# Patient Record
Sex: Male | Born: 1975 | Race: White | Hispanic: No | Marital: Married | Smoking: Current every day smoker
Health system: Southern US, Community
[De-identification: ages and names within clinical notes are randomized; demographics above are authoritative.]

## PROBLEM LIST (undated history)

## (undated) DIAGNOSIS — G4700 Insomnia, unspecified: Secondary | ICD-10-CM

## (undated) DIAGNOSIS — K625 Hemorrhage of anus and rectum: Secondary | ICD-10-CM

## (undated) DIAGNOSIS — F32A Depression, unspecified: Secondary | ICD-10-CM

## (undated) DIAGNOSIS — Z720 Tobacco use: Secondary | ICD-10-CM

## (undated) DIAGNOSIS — K219 Gastro-esophageal reflux disease without esophagitis: Secondary | ICD-10-CM

## (undated) DIAGNOSIS — E785 Hyperlipidemia, unspecified: Secondary | ICD-10-CM

## (undated) DIAGNOSIS — F329 Major depressive disorder, single episode, unspecified: Secondary | ICD-10-CM

## (undated) DIAGNOSIS — D473 Essential (hemorrhagic) thrombocythemia: Secondary | ICD-10-CM

## (undated) DIAGNOSIS — E7800 Pure hypercholesterolemia, unspecified: Secondary | ICD-10-CM

## (undated) HISTORY — DX: Major depressive disorder, single episode, unspecified: F32.9

## (undated) HISTORY — DX: Depression, unspecified: F32.A

## (undated) HISTORY — DX: Insomnia, unspecified: G47.00

## (undated) HISTORY — DX: Essential (hemorrhagic) thrombocythemia: D47.3

## (undated) HISTORY — DX: Hyperlipidemia, unspecified: E78.5

## (undated) HISTORY — DX: Gastro-esophageal reflux disease without esophagitis: K21.9

## (undated) HISTORY — DX: Pure hypercholesterolemia, unspecified: E78.00

## (undated) HISTORY — DX: Tobacco use: Z72.0

## (undated) HISTORY — DX: Hemorrhage of anus and rectum: K62.5

---

## 1999-06-19 ENCOUNTER — Ambulatory Visit (HOSPITAL_COMMUNITY): Admission: RE | Admit: 1999-06-19 | Discharge: 1999-06-19 | Payer: Self-pay | Admitting: Specialist

## 1999-06-19 ENCOUNTER — Encounter: Payer: Self-pay | Admitting: Specialist

## 2014-07-10 ENCOUNTER — Encounter (HOSPITAL_COMMUNITY): Payer: Self-pay | Admitting: Emergency Medicine

## 2014-07-10 ENCOUNTER — Emergency Department (HOSPITAL_COMMUNITY)
Admission: EM | Admit: 2014-07-10 | Discharge: 2014-07-10 | Disposition: A | Discharge status: Home / Self Care | Payer: BC Managed Care – PPO | Source: Home / Self Care

## 2014-07-10 DIAGNOSIS — K2970 Gastritis, unspecified, without bleeding: Secondary | ICD-10-CM

## 2014-07-10 MED ORDER — PANTOPRAZOLE SODIUM 40 MG PO TBEC: 40.0000 mg | DELAYED_RELEASE_TABLET | Freq: Every day | ORAL | Status: DC

## 2014-07-10 NOTE — Discharge Instructions (Signed)
Gastritis, Adult Zantac 150 mg twice a day Bland diet Protonix 40 mg a day Gastritis is soreness and swelling (inflammation) of the lining of the stomach. Gastritis can develop as a sudden onset (acute) or long-term (chronic) condition. If gastritis is not treated, it can lead to stomach bleeding and ulcers. CAUSES  Gastritis occurs when the stomach lining is weak or damaged. Digestive juices from the stomach then inflame the weakened stomach lining. The stomach lining may be weak or damaged due to viral or bacterial infections. One common bacterial infection is the Helicobacter pylori infection. Gastritis can also result from excessive alcohol consumption, taking certain medicines, or having too much acid in the stomach.  SYMPTOMS  In some cases, there are no symptoms. When symptoms are present, they may include:  Pain or a burning sensation in the upper abdomen.  Nausea.  Vomiting.  An uncomfortable feeling of fullness after eating. DIAGNOSIS  Your caregiver may suspect you have gastritis based on your symptoms and a physical exam. To determine the cause of your gastritis, your caregiver may perform the following:  Blood or stool tests to check for the H pylori bacterium.  Gastroscopy. A thin, flexible tube (endoscope) is passed down the esophagus and into the stomach. The endoscope has a light and camera on the end. Your caregiver uses the endoscope to view the inside of the stomach.  Taking a tissue sample (biopsy) from the stomach to examine under a microscope. TREATMENT  Depending on the cause of your gastritis, medicines may be prescribed. If you have a bacterial infection, such as an H pylori infection, antibiotics may be given. If your gastritis is caused by too much acid in the stomach, H2 blockers or antacids may be given. Your caregiver may recommend that you stop taking aspirin, ibuprofen, or other nonsteroidal anti-inflammatory drugs (NSAIDs). HOME CARE INSTRUCTIONS  Only  take over-the-counter or prescription medicines as directed by your caregiver.  If you were given antibiotic medicines, take them as directed. Finish them even if you start to feel better.  Drink enough fluids to keep your urine clear or pale yellow.  Avoid foods and drinks that make your symptoms worse, such as:  Caffeine or alcoholic drinks.  Chocolate.  Peppermint or mint flavorings.  Garlic and onions.  Spicy foods.  Citrus fruits, such as oranges, lemons, or limes.  Tomato-based foods such as sauce, chili, salsa, and pizza.  Fried and fatty foods.  Eat small, frequent meals instead of large meals. SEEK IMMEDIATE MEDICAL CARE IF:   You have black or dark red stools.  You vomit blood or material that looks like coffee grounds.  You are unable to keep fluids down.  Your abdominal pain gets worse.  You have a fever.  You do not feel better after 1 week.  You have any other questions or concerns. MAKE SURE YOU:  Understand these instructions.  Will watch your condition.  Will get help right away if you are not doing well or get worse. Document Released: 08/25/2001 Document Revised: 03/01/2012 Document Reviewed: 10/14/2011 [facility] Patient Information 2015 [place], [place]. This information is not intended to replace advice given to you by your health care provider. Make sure you discuss any questions you have with your health care provider.

## 2014-07-10 NOTE — ED Provider Notes (Signed)
CSN: [id number]     Arrival date & time 07/10/14  1645 History   First MD Initiated Contact with Patient 07/10/14 1714     Chief Complaint  Patient presents with  . Abdominal Pain   (Consider location/radiation/quality/duration/timing/severity/associated sxs/prior Treatment) HPI Comments: 38 year old male presents with epigastric pain for approximately 2 weeks. The pain is intermittent and located across the epigastrium. It is often improved after eating for approximately 2 hours before returning. Nothing seems to make it. He denies belching, indigestion, heartburn, nausea, vomiting, diarrhea or current constipation. Initially thought he was constipated and took magnesium citrate which allowed him to empty his colon. This had no benefit for his epigastric pain. 3 weeks ago he saw his PCP and was treated for sleep disturbance with Lunesta and dyslipidemia with Crestor.   History reviewed. No pertinent past medical history. History reviewed. No pertinent past surgical history. History reviewed. No pertinent family history. History  Substance Use Topics  . Smoking status: Current Every Day Smoker  . Smokeless tobacco: Not on file  . Alcohol Use: Yes    Review of Systems  Constitutional: Negative.  Negative for fever, chills and fatigue.  HENT: Negative.   Respiratory: Negative for cough, chest tightness, shortness of breath and wheezing.   Cardiovascular: Negative.   Gastrointestinal: Positive for abdominal pain. Negative for nausea, vomiting, diarrhea, blood in stool, abdominal distention, anal bleeding and rectal pain.  Genitourinary: Negative.   Skin: Negative.   Neurological: Negative for dizziness, seizures, syncope, speech difficulty, light-headedness and headaches.    Allergies  Review of patient's allergies indicates no known allergies.  Home Medications   Prior to Admission medications   Medication Sig Start Date End Date Taking? Authorizing Provider  Eszopiclone  (ESZOPICLONE) 3 MG TABS Take 3 mg by mouth at bedtime. Take immediately before bedtime   Yes Historical Provider, MD  PARoxetine (PAXIL-CR) 25 MG 24 hr tablet Take 25 mg by mouth daily.   Yes Historical Provider, MD  rosuvastatin (CRESTOR) 20 MG tablet Take 20 mg by mouth daily.   Yes Historical Provider, MD  pantoprazole (PROTONIX) 40 MG tablet Take 1 tablet (40 mg total) by mouth daily. 07/10/14   Janne Napoleon, NP   BP 134/82  Pulse 71  Temp(Src) 98.3 F (36.8 C) (Oral)  Resp 18  SpO2 98% Physical Exam  Nursing note and vitals reviewed. Constitutional: He is oriented to person, place, and time. He appears well-developed and well-nourished. No distress.  Eyes: Conjunctivae and EOM are normal.  Neck: Normal range of motion. Neck supple.  Cardiovascular: Normal rate, regular rhythm, normal heart sounds and intact distal pulses.   Pulmonary/Chest: Effort normal and breath sounds normal. No respiratory distress. He has no wheezes. He has no rales.  Abdominal: Soft. Bowel sounds are normal. He exhibits no distension and no mass. There is no rebound and no guarding.  Mild  tenderness in the epigastrium only.  Musculoskeletal: He exhibits no edema.  Neurological: He is alert and oriented to person, place, and time. He exhibits normal muscle tone.  Skin: Skin is warm and dry. No rash noted.  Psychiatric: He has a normal mood and affect.    ED Course  Procedures (including critical care time) Labs Review Labs Reviewed - No data to display  Imaging Review No results found.   MDM   1. Gastritis    Bland diet Zantac 150 bid protonix 40 q d Read instructions, Red flags discussed to seek medical care promptly. If not improving in 5-6  days, stop the [place] and Crestor for 4-5 days as a test for causality       Janne Napoleon, NP 07/10/14 1747

## 2014-07-10 NOTE — ED Provider Notes (Signed)
Medical screening examination/treatment/procedure(s) were performed by resident physician or non-physician practitioner and as supervising physician I was immediately available for consultation/collaboration.   Pauline Good MD.   Billy Fischer, MD 07/10/14 [phone]

## 2014-07-10 NOTE — ED Notes (Signed)
Pt  Reports    Upper abd  Pain   With     Symptoms          X   sev  Weeks            Pain is  In  The  Epigastric  Area         He took  Some  mag  Citrate   No  Vomiting       Sitting  Upright on  The  Exam table  In no  Severe     Distress

## 2018-12-05 ENCOUNTER — Telehealth: Payer: Self-pay

## 2018-12-05 NOTE — Telephone Encounter (Signed)
NOTES ON FILE 

## 2018-12-28 ENCOUNTER — Telehealth: Payer: Self-pay | Admitting: *Deleted

## 2018-12-28 NOTE — Progress Notes (Deleted)
Virtual Visit via Telephone Note   This visit type was conducted due to national recommendations for restrictions regarding the COVID-19 Pandemic (e.g. social distancing) in an effort to limit this patient's exposure and mitigate transmission in our community.  Due to his co-morbid illnesses, this patient is at least at moderate risk for complications without adequate follow up.  This format is felt to be most appropriate for this patient at this time.  The patient did not have access to video technology/had technical difficulties with video requiring transitioning to audio format only (telephone).  All issues noted in this document were discussed and addressed.  No physical exam could be performed with this format.  Please refer to the patient's chart for his  consent to telehealth for [facility].   Evaluation Performed:  Follow-up visit  Date:  12/28/2018   ID:  Mike Maddox, DOB [birth year], MRN [id number]  Patient Location: Home Provider Location: Office  PCP:  No primary care provider on file.  Cardiologist:  Johnsie Cancel Electrophysiologist:  None   Chief Complaint:  HLD  History of Present Illness:    Mike Maddox is a 43 y.o. male referred by Dr Sheilah Mins [facility] for HLD History of GERD, Smoking, Depression . Currently on crestor 40 mg daily Labs reviewed from 11/23/18 TC 248 TRig 90 HDL 80 LDL 150 Chol/HDL ratio 3.1 normal normal LFTls  Seems as though lipids were much better in 2017 ? Med compliance Using Citalopram and Welbutrin for depression . BP has been borderline   ***    The patient does not have symptoms concerning for COVID-19 infection (fever, chills, cough, or new shortness of breath).    No past medical history on file. No past surgical history on file.   No outpatient medications have been marked as taking for the 12/30/18 encounter (Appointment) with Josue Hector, MD.     Allergies:   Patient has no known allergies.   Social History    Tobacco Use  . Smoking status: Current Every Day Smoker  Substance Use Topics  . Alcohol use: Yes  . Drug use: Not on file     Family Hx: The patient's family history is not on file.  ROS:   Please see the history of present illness.     All other systems reviewed and are negative.   Prior CV studies:   The following studies were reviewed today:  Lab work primary March 2020  Labs/Other Tests and Data Reviewed:    EKG:  None   Recent Labs: No results found for requested labs within last 8760 hours.   Recent Lipid Panel No results found for: CHOL, TRIG, HDL, CHOLHDL, LDLCALC, LDLDIRECT  Wt Readings from Last 3 Encounters:  No data found for Wt     Objective:    Vital Signs:  There were no vitals taken for this visit.   No physical  ASSESSMENT & PLAN:    1. HLD:  TC/HDL ration fine very high HDL will do coronary calcium score to further risk stratify and identify goal of LDL lowering *** 2. BP:  Borderline continue lifestyle changes  3. Depression: continue welbutrin and citalopram 4. Smoking should have yearly CXR with primary consider adding patches to welbutrin   COVID-19 Education: The signs and symptoms of COVID-19 were discussed with the patient and how to seek care for testing (follow up with PCP or arrange E-visit).  The importance of social distancing was discussed today.  Time:  Today, I have spent 30 minutes with the patient with telehealth technology discussing the above problems.     Medication Adjustments/Labs and Tests Ordered: Current medicines are reviewed at length with the patient today.  Concerns regarding medicines are outlined above.   Tests Ordered:  Coronary calcium Score Repeat liver and lipid in 3 months Referral to lipid clinic   Medication Changes: No orders of the defined types were placed in this encounter.   Disposition:  Follow up with lipid clinic in 3 months   Signed, Jenkins Rouge, MD  12/28/2018 7:42 PM    [place]

## 2018-12-28 NOTE — Telephone Encounter (Signed)
REFERRAL SENT TO SCHEDULING AND NOTES ON FILE FROM DR. Leith.

## 2018-12-29 ENCOUNTER — Telehealth: Payer: Self-pay

## 2018-12-29 NOTE — Progress Notes (Signed)
Virtual Visit via Telephone Note   This visit type was conducted due to national recommendations for restrictions regarding the COVID-19 Pandemic (e.g. social distancing) in an effort to limit this patient's exposure and mitigate transmission in our community.  Due to his co-morbid illnesses, this patient is at least at moderate risk for complications without adequate follow up.  This format is felt to be most appropriate for this patient at this time.  The patient did not have access to video technology/had technical difficulties with video requiring transitioning to audio format only (telephone).  All issues noted in this document were discussed and addressed.  No physical exam could be performed with this format.  Please refer to the patient's chart for his  consent to telehealth for [facility].   Evaluation Performed:  Follow-up visit  Date:  12/30/2018   ID:  Mike Maddox, DOB 04-Jan-1976, MRN [id number]  Patient Location: Home Provider Location: Office  PCP:  Patient, No Pcp Per  Cardiologist:  Johnsie Cancel Electrophysiologist:  None   Chief Complaint:  HLD  History of Present Illness:    Mike Maddox is a 43 y.o. male referred by Faye Ramsay [facility] for HLD History of GERD, Smoking, Depression . Currently on crestor 40 mg daily Labs reviewed from 11/23/18 TC 248 TRig 90 HDL 80 LDL 150 Chol/HDL ratio 3.1 normal normal LFTls  Seems as though lipids were much better in 2017 ? Med compliance Using Citalopram and Welbutrin for depression . BP has been borderline Reviewed notes from [place] including labs 20 minutes  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 Active at work up / down ladders Has been on lipitor but made him feel bad. Now on 40 mg crestor No documented vascular disease BP been running high on no meds currently     The patient does not have symptoms concerning for COVID-19 infection (fever, chills, cough, or new  shortness of breath).    Past Medical History:  Diagnosis Date  . Depression   . Dyslipidemia   . 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 GERD (gastroesophageal reflux disease)   . Hypercholesterolemia   . Hyperlipidemia   . Insomnia   . Rectal bleeding   . Tobacco use    No past surgical history on file.   Current Meds  Medication Sig  . buPROPion (WELLBUTRIN XL) 150 MG 24 hr tablet Take 150 mg by mouth daily.  . citalopram (CELEXA) 40 MG tablet Take 40 mg by mouth daily.  . rosuvastatin (CRESTOR) 40 MG tablet Take 40 mg by mouth daily.     Allergies:   Ambien [zolpidem]; Chantix [varenicline tartrate]; and Lipitor [atorvastatin]   Social History   Tobacco Use  . Smoking status: Current Every Day Smoker  . Smokeless tobacco: Never Used  Substance Use Topics  . Alcohol use: Yes  . Drug use: Not on file     Family Hx: The patient's family history is not on file.  ROS:   Please see the history of present illness.     All other systems reviewed and are negative.   Prior CV studies:   The following studies were reviewed today:  Lab work primary March 2020  Labs/Other Tests and Data Reviewed:    EKG:  None   Recent Labs: No results found for requested labs within last 8760 hours.   Recent Lipid Panel No results found for: CHOL, TRIG, HDL, CHOLHDL, LDLCALC, LDLDIRECT  Wt Readings from Last 3 Encounters:  12/30/18 86.2 kg     Objective:    Vital Signs:  BP (!) 168/117   Pulse 73   Ht 6' (1.829 m)   Wt 86.2 kg   BMI 25.77 kg/m    No physical  ASSESSMENT & PLAN:    1. HLD:  TC/HDL ration fine very high HDL will do coronary calcium score to further risk stratify and identify goal of LDL lowering  Start zetia 10 mg f/u lipid clinic 3 months wit hlabs  2. BP:  Start cozaar 50 mg until he can make lifestyle changes  3. Depression: continue welbutrin and citalopram 4. Smoking should have yearly CXR with primary consider adding patches to  welbutrin   COVID-19 Education: The signs and symptoms of COVID-19 were discussed with the patient and how to seek care for testing (follow up with PCP or arrange E-visit).  The importance of social distancing was discussed today.  Time:   Today, I have spent 30 minutes with the patient with telehealth technology discussing the above problems.     Medication Adjustments/Labs and Tests Ordered: Current medicines are reviewed at length with the patient today.  Concerns regarding medicines are outlined above.   Tests Ordered:  Coronary calcium Score Repeat liver and lipid in 3 months Referral to lipid clinic   Medication Changes: No orders of the defined types were placed in this encounter.   Disposition:  Follow up with lipid clinic in 3 months   Signed, Jenkins Rouge, MD  12/30/2018 9:22 AM    [place]

## 2018-12-29 NOTE — Telephone Encounter (Signed)
Went over patient's medications and updated medication list.

## 2018-12-30 ENCOUNTER — Other Ambulatory Visit: Payer: Self-pay

## 2018-12-30 ENCOUNTER — Ambulatory Visit: Payer: Self-pay | Admitting: Cardiovascular Disease

## 2018-12-30 ENCOUNTER — Telehealth (INDEPENDENT_AMBULATORY_CARE_PROVIDER_SITE_OTHER): Payer: 59 | Admitting: Cardiovascular Disease

## 2018-12-30 VITALS — BP 168/117 | HR 73 | Ht 72.0 in | Wt 190.0 lb

## 2018-12-30 DIAGNOSIS — E785 Hyperlipidemia, unspecified: Secondary | ICD-10-CM

## 2018-12-30 DIAGNOSIS — F329 Major depressive disorder, single episode, unspecified: Secondary | ICD-10-CM

## 2018-12-30 DIAGNOSIS — Z720 Tobacco use: Secondary | ICD-10-CM | POA: Diagnosis not present

## 2018-12-30 DIAGNOSIS — I10 Essential (primary) hypertension: Secondary | ICD-10-CM | POA: Diagnosis not present

## 2018-12-30 MED ORDER — LOSARTAN POTASSIUM 50 MG PO TABS: 50.0000 mg | ORAL_TABLET | Freq: Every day | ORAL | 3 refills | Status: DC

## 2018-12-30 MED ORDER — EZETIMIBE 10 MG PO TABS: 10.0000 mg | ORAL_TABLET | Freq: Every day | ORAL | 3 refills | Status: DC

## 2018-12-30 NOTE — Patient Instructions (Addendum)
Medication Instructions:  Your physician has recommended you make the following change in your medication:  1-START Zetia10 mg by mouth daily 2-START Losartan 50 mg by mouth daily  If you need a refill on your cardiac medications before your next appointment, please call your pharmacy.   Lab work: Your physician recommends that you return for lab work in: 3 months for fasting lipid panel and CMET  If you have labs (blood work) drawn today and your tests are completely normal, you will receive your results only by: Marland Kitchen MyChart Message (if you have MyChart) OR . A paper copy in the mail If you have any lab test that is abnormal or we need to change your treatment, we will call you to review the results.  Testing/Procedures: Cardiac CT scanning for Calcium Score in 4 weeks, (CAT scanning), is a noninvasive, special x-ray that produces cross-sectional images of the body using x-rays and a computer. CT scans help physicians diagnose and treat medical conditions. For some CT exams, a contrast material is used to enhance visibility in the area of the body being studied. CT scans provide greater clarity and reveal more details than regular x-ray exams.    Follow-Up: At [facility], you and your health needs are our priority.  As part of our continuing mission to provide you with exceptional heart care, we have created designated Provider Care Teams.  These Care Teams include your primary Cardiologist (physician) and Advanced Practice Providers (APPs -  Physician Assistants and Nurse Practitioners) who all work together to provide you with the care you need, when you need it. You will need a follow up appointment in 6 months.  Please call our office 2 months in advance to schedule this appointment.  You may see Dr. Johnsie Cancel or one of the following Advanced Practice Providers on your designated Care Team:   Truitt Merle, NP Cecilie Kicks, NP . Kathyrn Drown, NP   You have been referred to [facility] in 3 months.   Ezetimibe Tablets What is this medicine? EZETIMIBE (ez ET i mibe) blocks the absorption of cholesterol from the stomach. It can help lower blood cholesterol for patients who are at risk of getting heart disease or a stroke. It is only for patients whose cholesterol level is not controlled by diet. This medicine may be used for other purposes; ask your health care provider or pharmacist if you have questions. COMMON BRAND NAME(S): Zetia What should I tell my health care provider before I take this medicine? They need to know if you have any of these conditions: -liver disease -an unusual or allergic reaction to ezetimibe, medicines, foods, dyes, or preservatives -pregnant or trying to get pregnant -breast-feeding How should I use this medicine? Take this medicine by mouth with a glass of water. Follow the directions on the prescription label. This medicine can be taken with or without food. Take your doses at regular intervals. Do not take your medicine more often than directed. Talk to your pediatrician regarding the use of this medicine in children. Special care may be needed. Overdosage: If you think you have taken too much of this medicine contact a poison control center or emergency room at once. NOTE: This medicine is only for you. Do not share this medicine with others. What if I miss a dose? If you miss a dose, take it as soon as you can. If it is almost time for your next dose, take only that dose. Do not take double or extra  doses. What may interact with this medicine? Do not take this medicine with any of the following medications: -fenofibrate -gemfibrozil This medicine may also interact with the following medications: -antacids -cyclosporine -herbal medicines like red yeast rice -other medicines to lower cholesterol or triglycerides This list may not describe all possible interactions. Give your health care provider a list of all the medicines, herbs,  non-prescription drugs, or dietary supplements you use. Also tell them if you smoke, drink alcohol, or use illegal drugs. Some items may interact with your medicine. What should I watch for while using this medicine? Visit your doctor or health care professional for regular checks on your progress. You will need to have your cholesterol levels checked. If you are also taking some other cholesterol medicines, you will also need to have tests to make sure your liver is working properly. Tell your doctor or health care professional if you get any unexplained muscle pain, tenderness, or weakness, especially if you also have a fever and tiredness. You need to follow a low-cholesterol, low-fat diet while you are taking this medicine. This will decrease your risk of getting heart and blood vessel disease. Exercising and avoiding alcohol and smoking can also help. Ask your doctor or dietician for advice. What side effects may I notice from receiving this medicine? Side effects that you should report to your doctor or health care professional as soon as possible: -allergic reactions like skin rash, itching or hives, swelling of the face, lips, or tongue -dark yellow or brown urine -unusually weak or tired -yellowing of the skin or eyes Side effects that usually do not require medical attention (report to your doctor or health care professional if they continue or are bothersome): -diarrhea -dizziness -headache -stomach upset or pain This list may not describe all possible side effects. Call your doctor for medical advice about side effects. You may report side effects to FDA at 1-800-FDA-1088. Where should I keep my medicine? Keep out of the reach of children. Store at room temperature between 15 and 30 degrees C (59 and 86 degrees F). Protect from moisture. Keep container tightly closed. Throw away any unused medicine after the expiration date. NOTE: This sheet is a summary. It may not cover all possible  information. If you have questions about this medicine, talk to your doctor, pharmacist, or health care provider.  2019 Elsevier/Gold Standard (2012-03-07 15:39:09) Losartan tablets What is this medicine? LOSARTAN (loe SAR tan) is used to treat high blood pressure and to reduce the risk of stroke in certain patients. This drug also slows the progression of kidney disease in patients with diabetes. This medicine may be used for other purposes; ask your health care provider or pharmacist if you have questions. COMMON BRAND NAME(S): Cozaar What should I tell my health care provider before I take this medicine? They need to know if you have any of these conditions: -heart failure -kidney or liver disease -an unusual or allergic reaction to losartan, other medicines, foods, dyes, or preservatives -pregnant or trying to get pregnant -breast-feeding How should I use this medicine? Take this medicine by mouth with a glass of water. Follow the directions on the prescription label. This medicine can be taken with or without food. Take your doses at regular intervals. Do not take your medicine more often than directed. Talk to your pediatrician regarding the use of this medicine in children. Special care may be needed. Overdosage: If you think you have taken too much of this medicine contact a poison  control center or emergency room at once. NOTE: This medicine is only for you. Do not share this medicine with others. What if I miss a dose? If you miss a dose, take it as soon as you can. If it is almost time for your next dose, take only that dose. Do not take double or extra doses. What may interact with this medicine? -blood pressure medicines -diuretics, especially triamterene, spironolactone, or amiloride -fluconazole -NSAIDs, medicines for pain and inflammation, like ibuprofen or naproxen -potassium salts or potassium supplements -rifampin This list may not describe all possible interactions.  Give your health care provider a list of all the medicines, herbs, non-prescription drugs, or dietary supplements you use. Also tell them if you smoke, drink alcohol, or use illegal drugs. Some items may interact with your medicine. What should I watch for while using this medicine? Visit your doctor or health care professional for regular checks on your progress. Check your blood pressure as directed. Ask your doctor or health care professional what your blood pressure should be and when you should contact him or her. Call your doctor or health care professional if you notice an irregular or fast heart beat. Women should inform their doctor if they wish to become pregnant or think they might be pregnant. There is a potential for serious side effects to an unborn child, particularly in the second or third trimester. Talk to your health care professional or pharmacist for more information. You may get drowsy or dizzy. Do not drive, use machinery, or do anything that needs mental alertness until you know how this drug affects you. Do not stand or sit up quickly, especially if you are an older patient. This reduces the risk of dizzy or fainting spells. Alcohol can make you more drowsy and dizzy. Avoid alcoholic drinks. Avoid salt substitutes unless you are told otherwise by your doctor or health care professional. Do not treat yourself for coughs, colds, or pain while you are taking this medicine without asking your doctor or health care professional for advice. Some ingredients may increase your blood pressure. What side effects may I notice from receiving this medicine? Side effects that you should report to your doctor or health care professional as soon as possible: -confusion, dizziness, light headedness or fainting spells -decreased amount of urine passed -difficulty breathing or swallowing, hoarseness, or tightening of the throat -fast or irregular heart beat, palpitations, or chest pain -skin rash,  itching -swelling of your face, lips, tongue, hands, or feet Side effects that usually do not require medical attention (report to your doctor or health care professional if they continue or are bothersome): -cough -decreased sexual function or desire -headache -nasal congestion or stuffiness -nausea or stomach pain -sore or cramping muscles This list may not describe all possible side effects. Call your doctor for medical advice about side effects. You may report side effects to FDA at 1-800-FDA-1088. Where should I keep my medicine? Keep out of the reach of children. Store at room temperature between 15 and 30 degrees C (59 and 86 degrees F). Protect from light. Keep container tightly closed. Throw away any unused medicine after the expiration date. NOTE: This sheet is a summary. It may not cover all possible information. If you have questions about this medicine, talk to your doctor, pharmacist, or health care provider.  2019 Elsevier/Gold Standard (2007-11-11 16:42:18)

## 2019-01-16 ENCOUNTER — Telehealth: Payer: Self-pay | Admitting: Pharmacist

## 2019-01-16 NOTE — Telephone Encounter (Signed)
Patient referred to Lipid clinic by Dr. Johnsie Cancel on 4/17. Patient is diagnosed with hyperlipidemia with last labs on 11/23/2018: TC 248, Trigl 90, HDL 80, LDL 150. Patient does not have history of ASCVD or a positive family history of ASCVD. ASCVD 10-yr risk 5.4%. Patient has been on rosuvastatin 40mg  daily and Zetia was added at last Cardiology visit on 4/17. Could consider aggressive LDL goal of <100. Will confirm patient is adherent to rosuvastatin since LDL in 2017 was 70. Will educate on adherence to medications and schedule labs to be drawn in mid-July.  Attempt to reach patient unsuccessful, left voicemail with call pharmacy call back number.   Gwenlyn Found, [place].Brock D PGY1 Pharmacy Resident  01/16/2019   12:47 PM

## 2019-01-18 NOTE — Telephone Encounter (Signed)
Additional call attempted on 01/18/2019. Left message instructing patient to call back.

## 2019-01-19 NOTE — Telephone Encounter (Signed)
Spoke with patient.  He just started the ezetimibe last month.  States no issues with compliance with either that or rosuvastatin.  Will go to [facility] in July to repeat cholesterol labs and have those sent to Dr. Johnsie Cancel

## 2019-01-19 NOTE — Telephone Encounter (Signed)
Returned patient call on 01/19/2019. Left message with call back instructions.

## 2019-03-16 ENCOUNTER — Telehealth: Payer: Self-pay | Admitting: *Deleted

## 2019-03-16 NOTE — Telephone Encounter (Signed)
Script Screening patients for COVID-19 and reviewing new operational procedures  Greeting - The reason I am calling is to share with you some new changes to our processes that are designed to help us keep everyone safe. Is now a good time to speak with you? Patient says "no' - ask them when you can call back and let them know it's important to do this prior to their appointment.  Patient says "yes" - Great, [name]  the first thing I need to do is ask you some screening Questions.  1. To the best of your knowledge, have you been in close contact with any one with a confirmed diagnosis of COVID 19? o No - proceed to next question  2. Have you had any one or more of the following: fever, chills, cough, shortness of breath or any flu-like symptoms? o No - proceed to next question  3. Have you been diagnosed with or have a previous diagnosis of COVID 19? o No - proceed to next question  4. I am going to go over a few other symptoms with you. Please let me know if you are experiencing any of the following: . Ear, nose or throat discomfort . A sore throat . Headache . Muscle pain . Diarrhea . Loss of taste or smell o No - proceed to next question  Thank you for answering these questions. Please know we will ask you these questions or similar questions when you arrive for your appointment and again it's how we are keeping everyone safe. Also, to keep you safe, please use the provided hand sanitizer when you enter the building. Mike Maddox, we are asking everyone in the building to wear a mask because they help us prevent the spread of germs. Do you have a mask of your own, if not, we are happy to provide one for you. The last thing I want to go over with you is the no visitor guidelines. This means no one can attend the appointment with you unless you need physical assistance. I understand this may be different from your past appointments and I know this may be difficult but please know if  someone is driving you we are happy to call them for you once your appointment is over.  [INSERT SITE SPECIFIC CHECK IN PROCEDURES]  Mike Maddox I've given you a lot of information, what questions do you have about what I've talked about today or your appointment tomorrow? 

## 2019-03-20 ENCOUNTER — Inpatient Hospital Stay: Admission: RE | Admit: 2019-03-20 | Payer: Self-pay | Source: Ambulatory Visit

## 2019-04-04 ENCOUNTER — Telehealth: Payer: Self-pay

## 2019-04-04 NOTE — Telephone Encounter (Signed)
lmom for prescreen  

## 2019-04-05 ENCOUNTER — Telehealth: Payer: Self-pay | Admitting: Pharmacist

## 2019-04-05 NOTE — Telephone Encounter (Signed)
Patient wife called yesterday stating patient was out of town for work and wouldn't be able to make appointment on Thursday. Called patient back. Would like to reschedule next week when he gets back in town and know his schedule

## 2019-04-06 ENCOUNTER — Other Ambulatory Visit: Payer: Self-pay

## 2019-04-06 ENCOUNTER — Ambulatory Visit: Payer: Self-pay

## 2019-06-02 ENCOUNTER — Telehealth: Payer: Self-pay | Admitting: Cardiovascular Disease

## 2019-06-02 NOTE — Telephone Encounter (Signed)
Left message for patient to call back. Patient needs lab work before [place]. Labs are already ordered. Patient just needs to be scheduled a time to come in for lab work. Patient also needs fast lipid panel.

## 2019-06-02 NOTE — Telephone Encounter (Signed)
New Message    Patient calling to find out if he still needs to have labs before CT please call back to advise.

## 2019-06-06 NOTE — Telephone Encounter (Signed)
Left second message for patient to call back. Will send Mychart message.

## 2019-06-12 ENCOUNTER — Other Ambulatory Visit: Payer: 59 | Admitting: *Deleted

## 2019-06-12 ENCOUNTER — Other Ambulatory Visit: Payer: Self-pay

## 2019-06-12 DIAGNOSIS — E785 Hyperlipidemia, unspecified: Secondary | ICD-10-CM

## 2019-06-12 LAB — COMPREHENSIVE METABOLIC PANEL
ALT: 66 IU/L — ABNORMAL HIGH (ref 0–44)
AST: 55 IU/L — ABNORMAL HIGH (ref 0–40)
Albumin/Globulin Ratio: 2 (ref 1.2–2.2)
Albumin: 4.9 g/dL (ref 4.0–5.0)
Alkaline Phosphatase: 79 IU/L (ref 39–117)
BUN/Creatinine Ratio: 19 (ref 9–20)
BUN: 20 mg/dL (ref 6–24)
Bilirubin Total: 0.3 mg/dL (ref 0.0–1.2)
CO2: 18 mmol/L — ABNORMAL LOW (ref 20–29)
Calcium: 9.7 mg/dL (ref 8.7–10.2)
Chloride: 103 mmol/L (ref 96–106)
Creatinine, Ser: 1.08 mg/dL (ref 0.76–1.27)
GFR calc Af Amer: 99 mL/min/{1.73_m2} (ref >59)
GFR calc non Af Amer: 85 mL/min/{1.73_m2} (ref >59)
Globulin, Total: 2.5 g/dL (ref 1.5–4.5)
Glucose: 112 mg/dL — ABNORMAL HIGH (ref 65–99)
Potassium: 4.5 mmol/L (ref 3.5–5.2)
Sodium: 137 mmol/L (ref 134–144)
Total Protein: 7.4 g/dL (ref 6.0–8.5)

## 2019-06-12 LAB — LIPID PANEL
Chol/HDL Ratio: 2.8 ratio (ref 0.0–5.0)
Cholesterol, Total: 179 mg/dL (ref 100–199)
HDL: 65 mg/dL (ref >39)
LDL Chol Calc (NIH): 98 mg/dL (ref 0–99)
Triglycerides: 88 mg/dL (ref 0–149)
VLDL Cholesterol Cal: 16 mg/dL (ref 5–40)

## 2019-06-13 ENCOUNTER — Ambulatory Visit (INDEPENDENT_AMBULATORY_CARE_PROVIDER_SITE_OTHER)
Admission: RE | Admit: 2019-06-13 | Discharge: 2019-06-13 | Disposition: A | Discharge status: Home / Self Care | Payer: Self-pay | Source: Ambulatory Visit | Attending: Cardiovascular Disease | Admitting: Cardiovascular Disease

## 2019-06-13 DIAGNOSIS — E785 Hyperlipidemia, unspecified: Secondary | ICD-10-CM

## 2019-06-14 ENCOUNTER — Telehealth: Payer: Self-pay | Admitting: Cardiovascular Disease

## 2019-06-14 DIAGNOSIS — E7800 Pure hypercholesterolemia, unspecified: Secondary | ICD-10-CM

## 2019-06-14 DIAGNOSIS — R931 Abnormal findings on diagnostic imaging of heart and coronary circulation: Secondary | ICD-10-CM

## 2019-06-14 NOTE — Telephone Encounter (Signed)
Patient called back. Informed patient of his results. Per Dr. Johnsie Cancel, he has calcium in coronaries which is not usual for 43 yo, LDL above 70 on high dose crestor and zetia f/u lipid clinid to consider PSK 9. Patient verbalized understanding. Will send message to scheduling to call patient to set up appt.

## 2019-06-14 NOTE — Telephone Encounter (Signed)
New Message  Patient is returning call about results. Please give patient a call back.

## 2019-06-14 NOTE — Telephone Encounter (Signed)
Left message for patient to call back  

## 2019-06-14 NOTE — Telephone Encounter (Signed)
Patient had lab work done and has results.

## 2019-06-14 NOTE — Telephone Encounter (Signed)
Does have calcium in coronaries which is not usual for 43 yo LDL above 70 on high dose crestor and zetia f/u lipid clinic to consider PSK 9

## 2019-07-03 NOTE — Progress Notes (Unsigned)
Patient ID: Mike Maddox                 DOB: 03-Jul-1976                    MRN: [id number]     HPI: Mike Maddox is a 43 y.o. male patient referred to lipid clinic by Dr. Johnsie Cancel. PMH is significant for HLD, GERD, Smoking, Depression. CT cardiac scoring on 06/13/2019 revealed coronary calcium score of 5; it was unable to calculated % for age and sex as pt is < 45 yrs but this would likely be > 90 th percentile.   Per Dr. Johnsie Cancel, he has calcium in coronaries which is not usual for 43 yo with a LDL above 70 on high dose Crestor 40 mg and Zetia 10mg . Patient was referred to discuss addition of a PCSK9-inhibitor for lipid management.  Patient presents today to the lipid clinic in good spirits. LDL is above goal < 70 mg/dL and currently on..  Rosu and zetia - started 12/30/2018 and 11/29/2018 Bupropion XL 150 mg daily for depression  Family history? Compliance with rosu and zetia? Muscle pain? Any sides effects? Meds tried in the past and any side effects? Lipitor/Atorvastatin?? Dose?? Go over labs and goals, CAC score 5 Talk Repatha/Praluent - LDL by 60%, CV benefit, twice a month shot, minimal muscle cramping Confirm insurance-[employer] (commercial) - ask for card, scan missing some info at the bottom and [facility] [address]?  Cost free with Praluent with copay card Diet?? Sugars, carbs, alcohol Exercise??  Thoughts about quitting smoking?? How many cigs/day? How long been smoking?  Current Medications: Rosuvastatin 40 mg daily, Ezetimibe 10 mg daily Intolerances: Atorvastatin- felt bad Risk Factors: CAC 5, HLD, tobacco smoker LDL goal: < 70 mg/dL  Diet:   Exercise:   Family History:   Social History: tobacco smoker, alcohol use  Labs: 06/12/2019: LDL 98, TG 88, TC 179, HDL 65 (Rosu 40mg , Zetia 10mg ) 11/23/18: LDL 150, TG 90, TC 248, HDL 80 (no therapy)  Past Medical History:  Diagnosis Date  . Depression   . Dyslipidemia   . 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  GERD (gastroesophageal reflux disease)   . Hypercholesterolemia   . Hyperlipidemia   . Insomnia   . Rectal bleeding   . Tobacco use     Current Outpatient Medications on File Prior to Visit  Medication Sig Dispense Refill  . buPROPion (WELLBUTRIN XL) 150 MG 24 hr tablet Take 150 mg by mouth daily.    . citalopram (CELEXA) 40 MG tablet Take 40 mg by mouth daily.    Marland Kitchen ezetimibe (ZETIA) 10 MG tablet Take 1 tablet (10 mg total) by mouth daily. 90 tablet 3  . losartan (COZAAR) 50 MG tablet Take 1 tablet (50 mg total) by mouth daily. 90 tablet 3  . rosuvastatin (CRESTOR) 40 MG tablet Take 40 mg by mouth daily.     No current facility-administered medications on file prior to visit.     Allergies  Allergen Reactions  . Ambien [Zolpidem]     GROGGY  . Chantix [Varenicline Tartrate]     IRRIABILITY  . Lipitor [Atorvastatin]     MUSCLE ACHES,     Assessment/Plan:  1. Hyperlipidemia - LDL above goal < 70 mg/dL. Start...   Recommend rechecking a fasting lipid panel in 2 months to monitor efficacy of Repatha.  Follow-up in lipid clinic in2-3 months.  Lorel Monaco, PharmD PGY1 Ambulatory Care Pharmacy Resident [facility]  [place] [address] [place], [place] [address] Phone: [phone]; Fax: [phone]

## 2019-07-04 ENCOUNTER — Ambulatory Visit: Payer: 59 | Admitting: Pharmacist

## 2019-07-04 ENCOUNTER — Telehealth: Payer: Self-pay | Admitting: Pharmacist

## 2019-07-04 NOTE — Telephone Encounter (Signed)
Left message for pt - no showed to PharmD visit today to discuss PCSK9i therapy.

## 2019-07-26 ENCOUNTER — Other Ambulatory Visit: Payer: Self-pay

## 2019-07-26 DIAGNOSIS — Z20822 Contact with and (suspected) exposure to covid-19: Secondary | ICD-10-CM

## 2019-07-28 LAB — NOVEL CORONAVIRUS, NAA: SARS-CoV-2, NAA: NOT DETECTED

## 2019-08-23 ENCOUNTER — Encounter: Payer: Self-pay | Admitting: General Practice

## 2019-10-03 ENCOUNTER — Ambulatory Visit
Admission: RE | Admit: 2019-10-03 | Discharge: 2019-10-03 | Disposition: A | Discharge status: Home / Self Care | Payer: 59 | Source: Ambulatory Visit | Attending: Chiropractor | Admitting: Chiropractor

## 2019-10-03 ENCOUNTER — Other Ambulatory Visit: Payer: Self-pay | Admitting: Chiropractor

## 2019-10-03 ENCOUNTER — Other Ambulatory Visit: Payer: Self-pay

## 2019-10-03 DIAGNOSIS — M545 Low back pain, unspecified: Secondary | ICD-10-CM

## 2019-10-29 NOTE — Progress Notes (Deleted)
Virtual Visit via Telephone Note   This visit type was conducted due to national recommendations for restrictions regarding the COVID-19 Pandemic (e.g. social distancing) in an effort to limit this patient's exposure and mitigate transmission in our community.  Due to his co-morbid illnesses, this patient is at least at moderate risk for complications without adequate follow up.  This format is felt to be most appropriate for this patient at this time.  The patient did not have access to video technology/had technical difficulties with video requiring transitioning to audio format only (telephone).  All issues noted in this document were discussed and addressed.  No physical exam could be performed with this format.  Please refer to the patient's chart for his  consent to telehealth for [facility].   Evaluation Performed:  Follow-up visit  Date:  10/29/2019   ID:  Mike Maddox, DOB [birth year], MRN [id number]  Patient Location: Home Provider Location: Office  PCP:  Harlan Stains, MD  Cardiologist:  Johnsie Cancel Electrophysiologist:  None   Chief Complaint:  HLD  History of Present Illness:    Mike Maddox is a 44 y.o. male referred by [facility] for HLD History of GERD, Smoking, Depression . First seen 12/30/18  At that time on crestor 40 mg daily Labs reviewed from 11/23/18 TC 248 TRig 90 HDL 80 LDL 150 Chol/HDL ratio 3.1 normal normal LFTls  Zetia was added  Seems as though lipids were much better in 2017 ? Med compliance Using Citalopram and Welbutrin for depression . BP has been borderline    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 Active at work up / down ladders Has been on lipitor but made him feel bad. Now on 40 mg crestor No documented vascular disease    06/13/19 calcium score done:  Score 5 which was >90 th percentile  Last lipids in Epic 06/12/19 LDL 98 TC 179 HDL 16 Was asked to f/u in lipid clinic to  discuss PSK9 Rx but did not show for appointment  Started on Cozaar 50 mg for BP 12/30/18   ***  The patient does not have symptoms concerning for COVID-19 infection (fever, chills, cough, or new shortness of breath).    Past Medical History:  Diagnosis Date  . Depression   . Dyslipidemia   . 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 GERD (gastroesophageal reflux disease)   . Hypercholesterolemia   . Hyperlipidemia   . Insomnia   . Rectal bleeding   . Tobacco use    No past surgical history on file.   No outpatient medications have been marked as taking for the 11/03/19 encounter (Appointment) with Josue Hector, MD.     Allergies:   Ambien [zolpidem], Chantix [varenicline tartrate], and Lipitor [atorvastatin]   Social History   Tobacco Use  . Smoking status: Current Every Day Smoker  . Smokeless tobacco: Never Used  Substance Use Topics  . Alcohol use: Yes  . Drug use: Not on file     Family Hx: The patient's family history includes Alcohol abuse in his cousin; Anxiety disorder in his father and mother; CAD in his paternal grandfather; Cancer in his paternal aunt; Cancer - Lung in his maternal grandmother; Depression in his mother; Hypertension in his paternal grandfather.  ROS:   Please see the history of present illness.     All other systems reviewed and are negative.   Prior CV studies:  The following studies were reviewed today:  Lab work primary March 2020  Labs/Other Tests and Data Reviewed:    EKG:  None   Recent Labs: 06/12/2019: ALT 66; BUN 20; Creatinine, Ser 1.08; Potassium 4.5; Sodium 137   Recent Lipid Panel Lab Results  Component Value Date/Time   CHOL 179 06/12/2019 08:45 AM   TRIG 88 06/12/2019 08:45 AM   HDL 65 06/12/2019 08:45 AM   CHOLHDL 2.8 06/12/2019 08:45 AM   LDLCALC 98 06/12/2019 08:45 AM    Wt Readings from Last 3 Encounters:  12/30/18 190 lb (86.2 kg)     Objective:    Vital Signs:  There were no vitals  taken for this visit.   No distress No JVP elevation Skin warm and dry No tachypnea   ASSESSMENT & PLAN:    1. HLD:  High calcium score for age LDL 90 when last checked *** 2. BP:  Improved on Cozaar  3. Depression: continue welbutrin and citalopram 4. Smoking should have yearly CXR with primary consider adding patches to welbutrin   COVID-19 Education: The signs and symptoms of COVID-19 were discussed with the patient and how to seek care for testing (follow up with PCP or arrange E-visit).  The importance of social distancing was discussed today.  Time:   Today, I have spent 30 minutes with the patient with telehealth technology discussing the above problems.     Medication Adjustments/Labs and Tests Ordered: Current medicines are reviewed at length with the patient today.  Concerns regarding medicines are outlined above.   Tests Ordered:  CXR- smoking Lipid and Liver ***  Medication Changes: No orders of the defined types were placed in this encounter.   Disposition:  Follow up in a year   Signed, Jenkins Rouge, MD  10/29/2019 11:27 AM    [place]

## 2019-11-03 ENCOUNTER — Telehealth: Payer: 59 | Admitting: Cardiovascular Disease

## 2019-12-18 NOTE — Progress Notes (Signed)
Virtual Visit via Telephone Note   This visit type was conducted due to national recommendations for restrictions regarding the COVID-19 Pandemic (e.g. social distancing) in an effort to limit this patient's exposure and mitigate transmission in our community.  Due to his co-morbid illnesses, this patient is at least at moderate risk for complications without adequate follow up.  This format is felt to be most appropriate for this patient at this time.  The patient did not have access to video technology/had technical difficulties with video requiring transitioning to audio format only (telephone).  All issues noted in this document were discussed and addressed.  No physical exam could be performed with this format.  Please refer to the patient's chart for his  consent to telehealth for [facility].   Evaluation Performed:  Follow-up visit  Date:  12/25/2019   ID:  Mike Maddox, DOB 1976/07/15, MRN [id number]  Patient Location: Home Provider Location: Office  PCP:  Harlan Stains, MD  Cardiologist:  Johnsie Cancel Electrophysiologist:  None   Chief Complaint:  HLD  History of Present Illness:    Mike Maddox is a 44 y.o. male referred by [facility] 12/30/18 for HLD History of GERD, Smoking, Depression . Was on crestor 40 mg daily Labs reviewed from 11/23/18 TC 248 TRig 90 HDL 80 LDL 150 Chol/HDL ratio 3.1 normal normal LFTls  Seems as though lipids were much better in 2017 ? Med compliance Using Citalopram and Welbutrin for depression .    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 Active at work up / down ladders Has been on lipitor but made him feel bad.    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    Past Medical History:  Diagnosis Date  . Depression   . Dyslipidemia   . 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 GERD (gastroesophageal reflux disease)   . Hypercholesterolemia   . Hyperlipidemia   . Insomnia   . Rectal bleeding   . Tobacco use    No past surgical history on file.   Current Meds  Medication Sig  . buPROPion (WELLBUTRIN XL) 150 MG 24 hr tablet Take 150 mg by mouth daily.  . citalopram (CELEXA) 40 MG tablet Take 40 mg by mouth daily.  Marland Kitchen ezetimibe (ZETIA) 10 MG tablet Take 1 tablet (10 mg total) by mouth daily.  Marland Kitchen losartan (COZAAR) 50 MG tablet Take 1 tablet (50 mg total) by mouth daily.  . rosuvastatin (CRESTOR) 40 MG tablet Take 40 mg by mouth daily.     Allergies:   Ambien [zolpidem], Chantix [varenicline tartrate], and Lipitor [atorvastatin]   Social History   Tobacco Use  . Smoking status: Current Every Day Smoker  . Smokeless tobacco: Never Used  Substance Use Topics  . Alcohol use: Yes  . Drug use: Not on file     Family Hx: The patient's family history includes Alcohol abuse in his cousin; Anxiety disorder in his father and mother; CAD in his paternal grandfather; Cancer in his paternal aunt; Cancer - Lung in his maternal grandmother; Depression in his mother; Hypertension in his paternal grandfather.  ROS:  Please see the history of present illness.     All other systems reviewed and are negative.   Prior CV studies:   The following studies were reviewed today:  Lab work primary March 2020 Calcium Score  06/13/19   Labs/Other Tests and Data Reviewed:    EKG:  None   Recent Labs: 06/12/2019: ALT 66; BUN 20; Creatinine, Ser 1.08; Potassium 4.5; Sodium 137   Recent Lipid Panel Lab Results  Component Value Date/Time   CHOL 179 06/12/2019 08:45 AM   TRIG 88 06/12/2019 08:45 AM   HDL 65 06/12/2019 08:45 AM   CHOLHDL 2.8 06/12/2019 08:45 AM   LDLCALC 98 06/12/2019  08:45 AM    Wt Readings from Last 3 Encounters:  12/25/19 190 lb (86.2 kg)  12/30/18 190 lb (86.2 kg)     Objective:    Vital Signs:  BP (!) 149/90   Pulse 73   Ht 6' (1.829 m)   Wt 190 lb (86.2 kg)   BMI 25.77 kg/m    No physical  ASSESSMENT & PLAN:    1. HLD:  With high calcium score for age will get labs from primary continue statin and zetia   2. BP:  Improved on ARB continue life style changes  3. Depression: continue welbutrin and citalopram 4. Smoking should have yearly CXR with primary consider adding patches to welbutrin    COVID-19 Education: The signs and symptoms of COVID-19 were discussed with the patient and how to seek care for testing (follow up with PCP or arrange E-visit).  The importance of social distancing was discussed today.  Time:   Today, I have spent 20 minutes with the patient with telehealth technology discussing the above problems.     Medication Adjustments/Labs and Tests Ordered: Current medicines are reviewed at length with the patient today.  Concerns regarding medicines are outlined above.   Tests Ordered:  None   Medication Changes: No orders of the defined types were placed in this encounter.   Disposition:  Follow up  In a year   Signed, Jenkins Rouge, MD  12/25/2019 8:14 AM    [place]

## 2019-12-25 ENCOUNTER — Telehealth (INDEPENDENT_AMBULATORY_CARE_PROVIDER_SITE_OTHER): Payer: 59 | Admitting: Cardiovascular Disease

## 2019-12-25 ENCOUNTER — Encounter: Payer: Self-pay | Admitting: Cardiovascular Disease

## 2019-12-25 VITALS — BP 149/90 | HR 73 | Ht 72.0 in | Wt 190.0 lb

## 2019-12-25 DIAGNOSIS — Z720 Tobacco use: Secondary | ICD-10-CM

## 2019-12-25 DIAGNOSIS — I10 Essential (primary) hypertension: Secondary | ICD-10-CM | POA: Diagnosis not present

## 2019-12-25 DIAGNOSIS — F329 Major depressive disorder, single episode, unspecified: Secondary | ICD-10-CM

## 2019-12-25 DIAGNOSIS — E782 Mixed hyperlipidemia: Secondary | ICD-10-CM | POA: Diagnosis not present

## 2019-12-25 NOTE — Patient Instructions (Addendum)

## 2020-01-14 ENCOUNTER — Other Ambulatory Visit: Payer: Self-pay | Admitting: Cardiovascular Disease

## 2020-01-22 ENCOUNTER — Other Ambulatory Visit: Payer: Self-pay | Admitting: Cardiovascular Disease

## 2020-06-01 IMAGING — CR DG LUMBAR SPINE 2-3V
3 series · 3 of 3 positions shown · non-contrast
Comparison: None

CLINICAL DATA: RIGHT-side low back pain for 1 year radiating down
leg to foot

EXAM:
LUMBAR SPINE - 2-3 VIEW

[w lumbar spine ap]
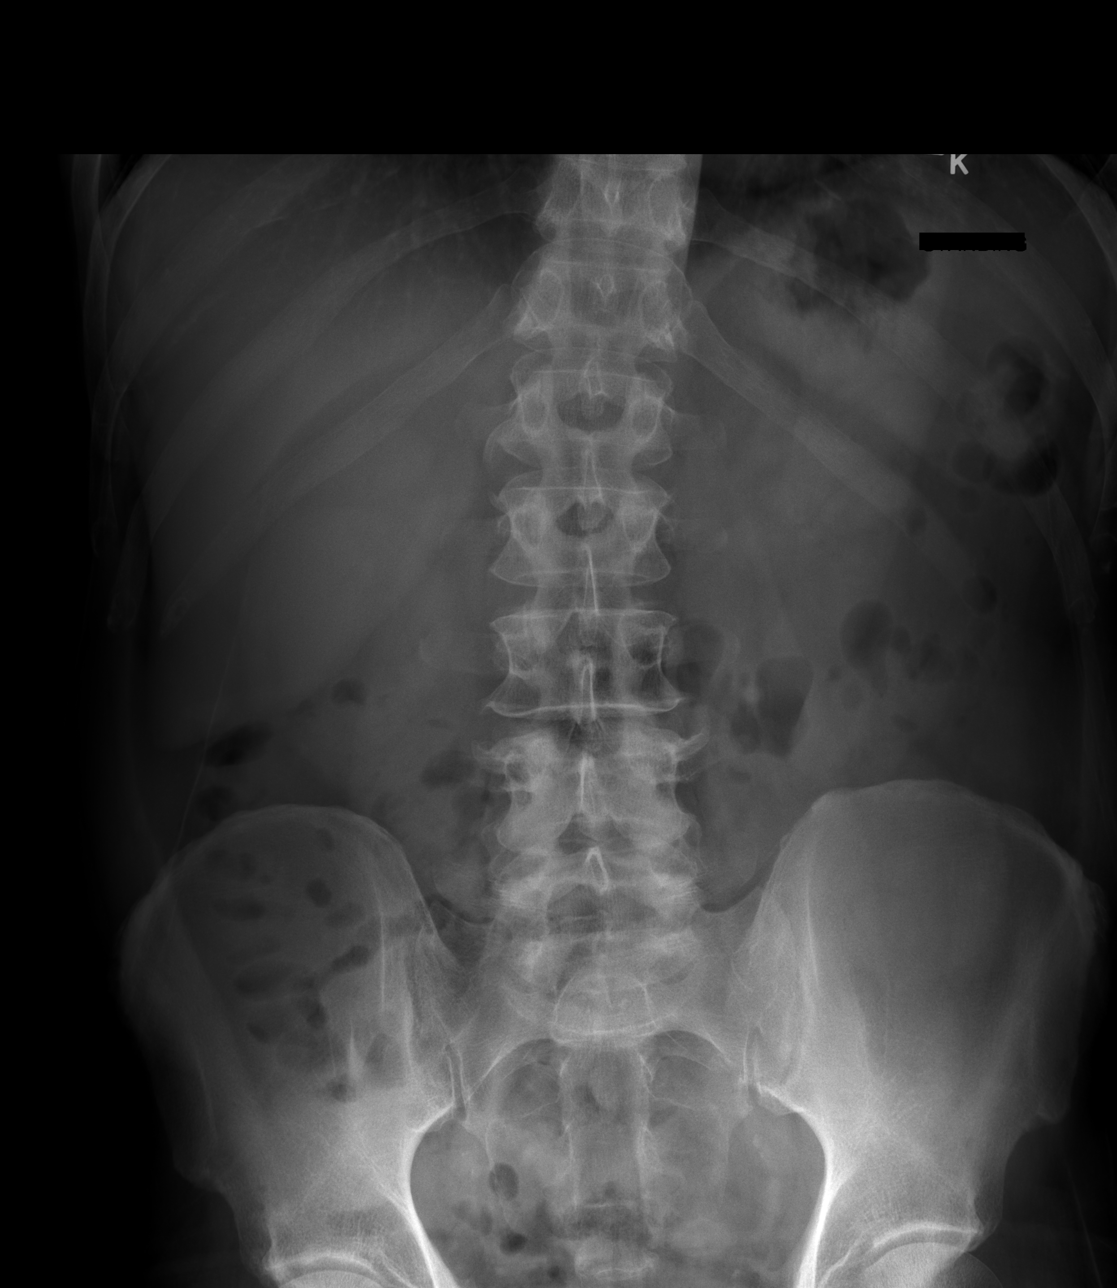

[w lumbar spine lat]
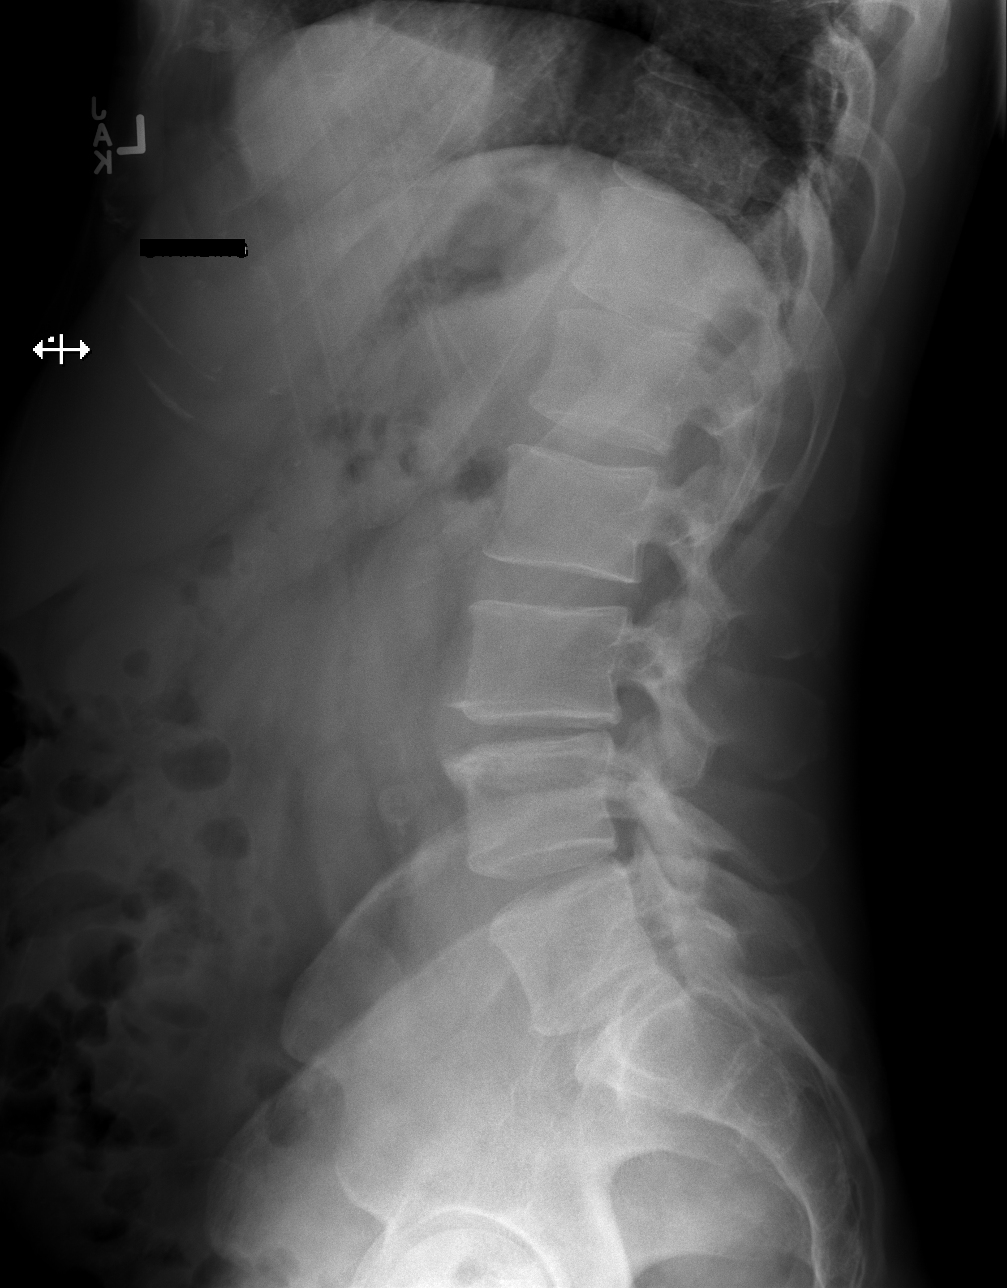

[w lumbar l-5 s-1 spot]
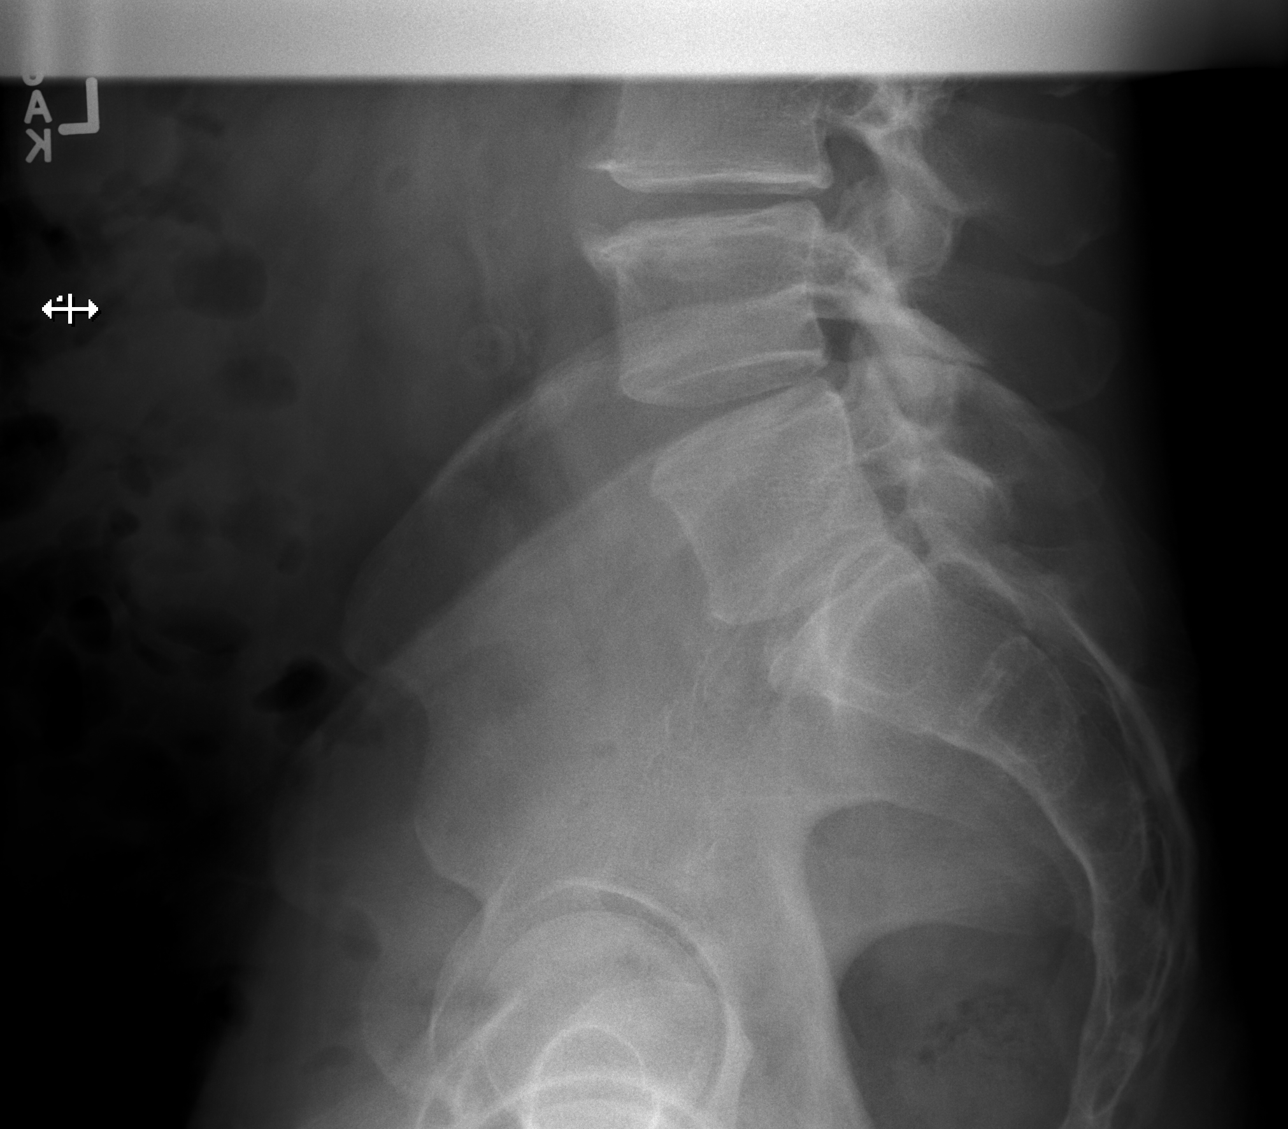

[3 of 3 positions shown; findings below may reference images not displayed]

FINDINGS: 5 non-rib-bearing lumbar vertebra.

Disc space narrowing and endplate spur formation L3-L4.

Mild disc space narrowing L4-L5.

Minimal dextroconvex lumbar scoliosis.

Additional tiny endplate spurs L1-L2.

No fracture, subluxation, or bone destruction.

No obvious spondylolysis.

SI joints preserved.
IMPRESSION: Mild scattered degenerative disc disease changes lumbar spine as
above.

## 2020-09-21 ENCOUNTER — Other Ambulatory Visit: Payer: Self-pay

## 2020-09-21 ENCOUNTER — Other Ambulatory Visit: Payer: 59

## 2020-09-21 DIAGNOSIS — Z20822 Contact with and (suspected) exposure to covid-19: Secondary | ICD-10-CM

## 2020-09-26 LAB — NOVEL CORONAVIRUS, NAA: SARS-CoV-2, NAA: NOT DETECTED

## 2021-05-29 DIAGNOSIS — E785 Hyperlipidemia, unspecified: Secondary | ICD-10-CM | POA: Diagnosis not present

## 2021-05-29 DIAGNOSIS — F329 Major depressive disorder, single episode, unspecified: Secondary | ICD-10-CM | POA: Diagnosis not present

## 2021-05-29 DIAGNOSIS — L989 Disorder of the skin and subcutaneous tissue, unspecified: Secondary | ICD-10-CM | POA: Diagnosis not present

## 2021-05-29 DIAGNOSIS — I10 Essential (primary) hypertension: Secondary | ICD-10-CM | POA: Diagnosis not present

## 2021-06-18 ENCOUNTER — Other Ambulatory Visit: Payer: Self-pay | Admitting: Physician Assistant

## 2021-06-18 DIAGNOSIS — L989 Disorder of the skin and subcutaneous tissue, unspecified: Secondary | ICD-10-CM

## 2021-06-30 ENCOUNTER — Other Ambulatory Visit: Payer: 59

## 2021-07-07 ENCOUNTER — Ambulatory Visit
Admission: RE | Admit: 2021-07-07 | Discharge: 2021-07-07 | Disposition: A | Discharge status: Home / Self Care | Payer: 59 | Source: Ambulatory Visit | Attending: Physician Assistant | Admitting: Physician Assistant

## 2021-07-07 DIAGNOSIS — L989 Disorder of the skin and subcutaneous tissue, unspecified: Secondary | ICD-10-CM

## 2021-07-07 DIAGNOSIS — R2241 Localized swelling, mass and lump, right lower limb: Secondary | ICD-10-CM | POA: Diagnosis not present

## 2021-08-20 DIAGNOSIS — J029 Acute pharyngitis, unspecified: Secondary | ICD-10-CM | POA: Diagnosis not present

## 2021-08-20 DIAGNOSIS — R059 Cough, unspecified: Secondary | ICD-10-CM | POA: Diagnosis not present

## 2021-08-20 DIAGNOSIS — Z03818 Encounter for observation for suspected exposure to other biological agents ruled out: Secondary | ICD-10-CM | POA: Diagnosis not present

## 2021-12-13 DIAGNOSIS — M10071 Idiopathic gout, right ankle and foot: Secondary | ICD-10-CM | POA: Diagnosis not present

## 2021-12-29 DIAGNOSIS — M10071 Idiopathic gout, right ankle and foot: Secondary | ICD-10-CM | POA: Diagnosis not present

## 2022-01-28 DIAGNOSIS — T1502XA Foreign body in cornea, left eye, initial encounter: Secondary | ICD-10-CM | POA: Diagnosis not present

## 2022-02-16 DIAGNOSIS — R7301 Impaired fasting glucose: Secondary | ICD-10-CM | POA: Diagnosis not present

## 2022-02-16 DIAGNOSIS — I10 Essential (primary) hypertension: Secondary | ICD-10-CM | POA: Diagnosis not present

## 2022-02-16 DIAGNOSIS — K219 Gastro-esophageal reflux disease without esophagitis: Secondary | ICD-10-CM | POA: Diagnosis not present

## 2022-02-16 DIAGNOSIS — Z23 Encounter for immunization: Secondary | ICD-10-CM | POA: Diagnosis not present

## 2022-02-16 DIAGNOSIS — F329 Major depressive disorder, single episode, unspecified: Secondary | ICD-10-CM | POA: Diagnosis not present

## 2022-02-16 DIAGNOSIS — E785 Hyperlipidemia, unspecified: Secondary | ICD-10-CM | POA: Diagnosis not present

## 2022-02-16 DIAGNOSIS — Z0000 Encounter for general adult medical examination without abnormal findings: Secondary | ICD-10-CM | POA: Diagnosis not present

## 2022-03-03 DIAGNOSIS — R748 Abnormal levels of other serum enzymes: Secondary | ICD-10-CM | POA: Diagnosis not present

## 2022-03-03 DIAGNOSIS — R945 Abnormal results of liver function studies: Secondary | ICD-10-CM | POA: Diagnosis not present

## 2022-03-06 ENCOUNTER — Other Ambulatory Visit: Payer: Self-pay | Admitting: Physician Assistant

## 2022-03-06 DIAGNOSIS — R748 Abnormal levels of other serum enzymes: Secondary | ICD-10-CM

## 2022-03-06 IMAGING — US US EXTREM LOW*R* LIMITED
1 series · 14 of 16 positions shown · non-contrast
Comparison: None.

CLINICAL DATA: Palpable anterolateral right knee mass for several
months.

EXAM:
ULTRASOUND RIGHT LOWER EXTREMITY LIMITED
TECHNIQUE: Ultrasound examination of the lower extremity soft tissues was
performed in the area of clinical concern.

[Series 1: us extrem low*right* limited · 0.05mm/px · 16 acquisitions, 14 frames shown]
[im 1/16]
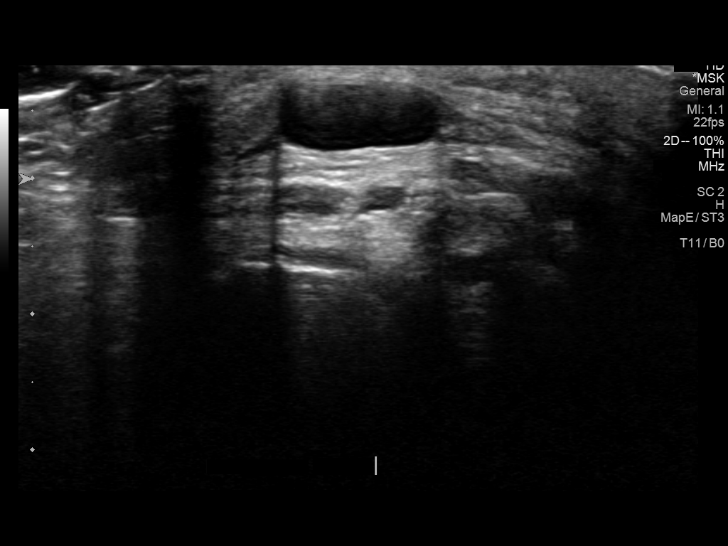
[im 2/16]
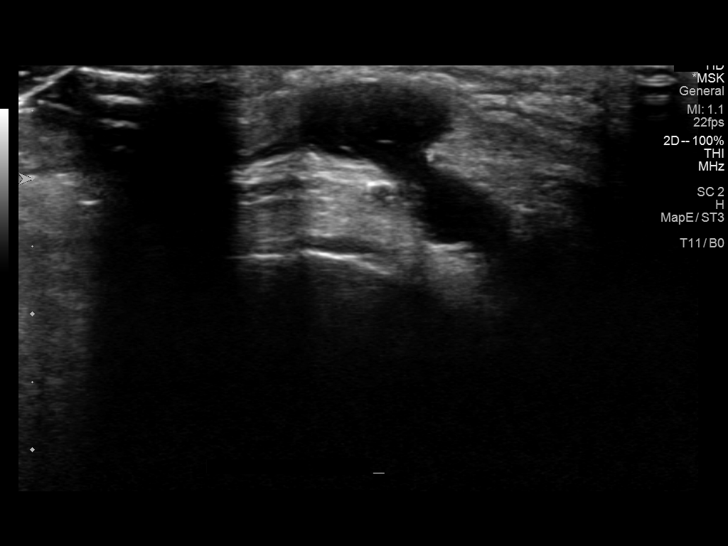
[im 3/16]
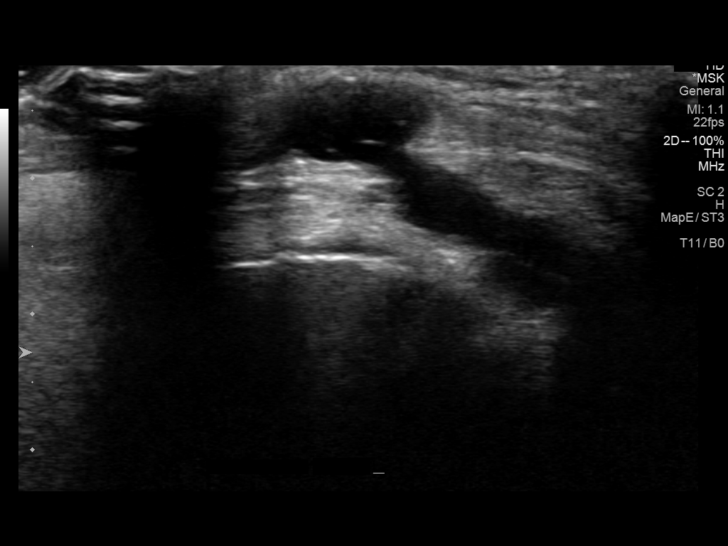
[im 5/16]
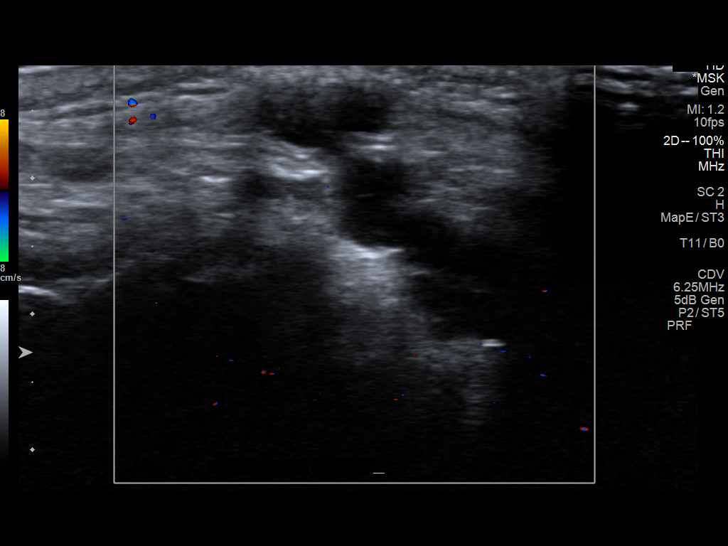
[im 6/16]
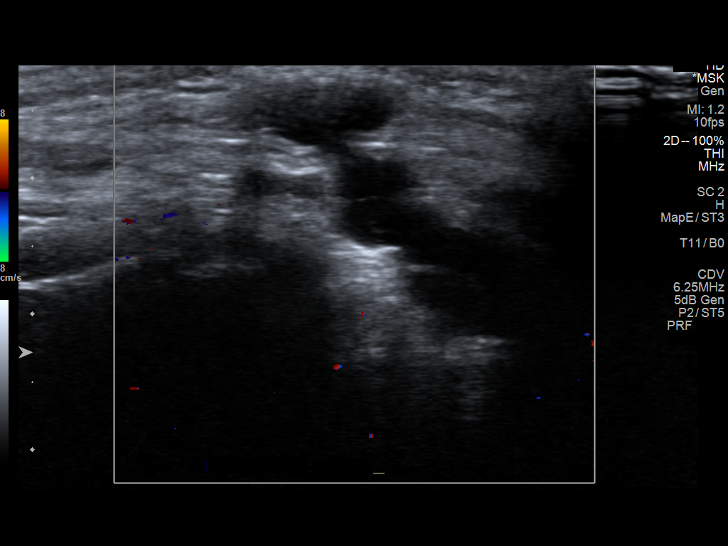
[im 7/16]
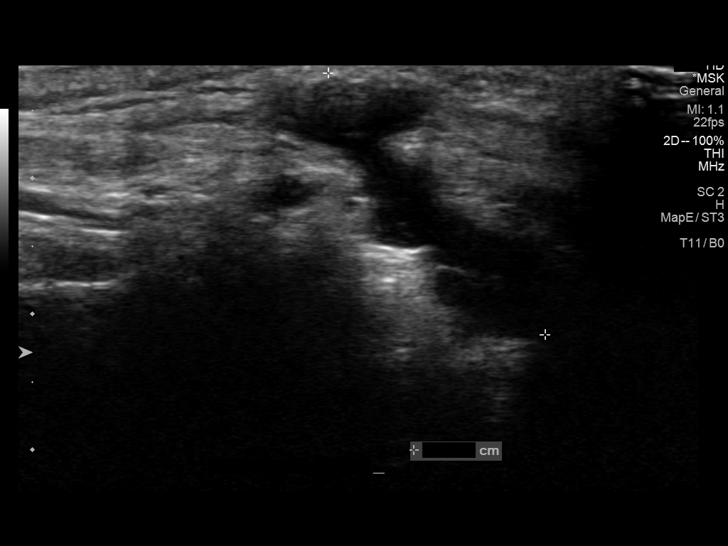
[im 8/16]
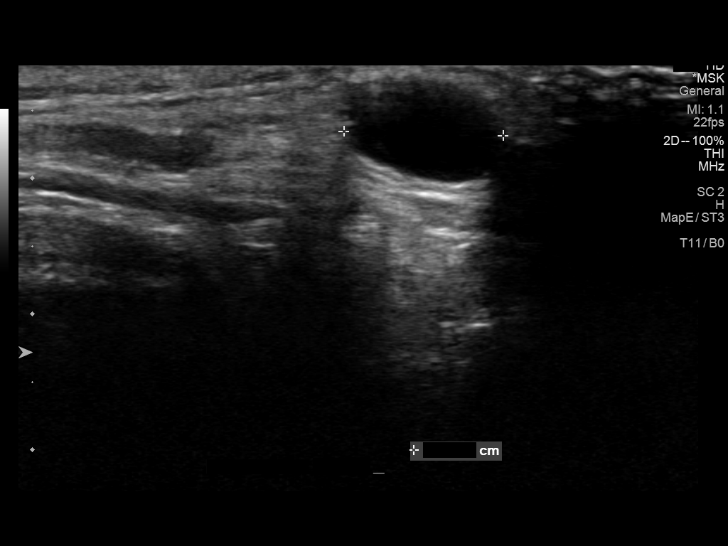
[im 9/16]
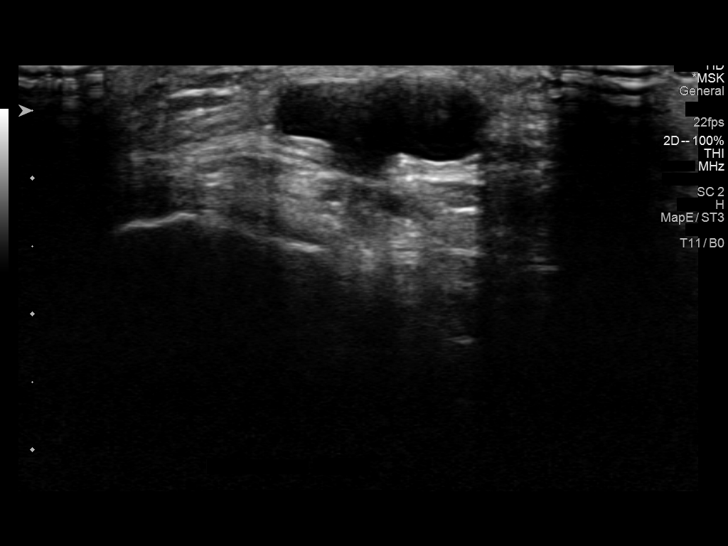
[im 10/16]
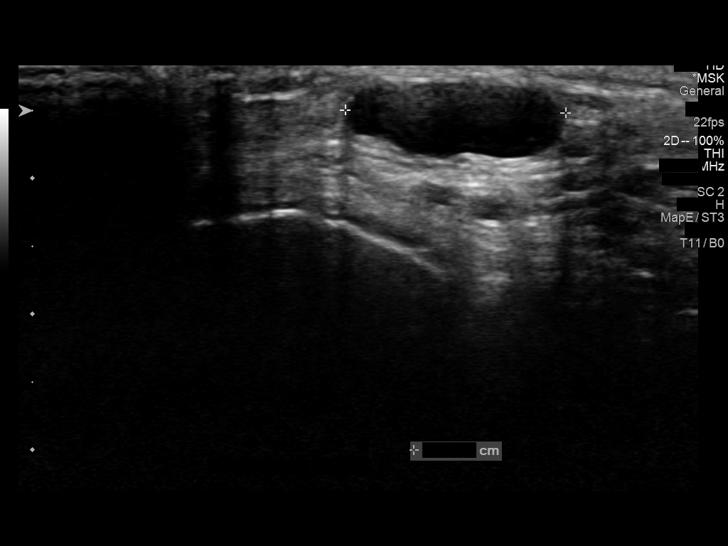
[im 11/16]
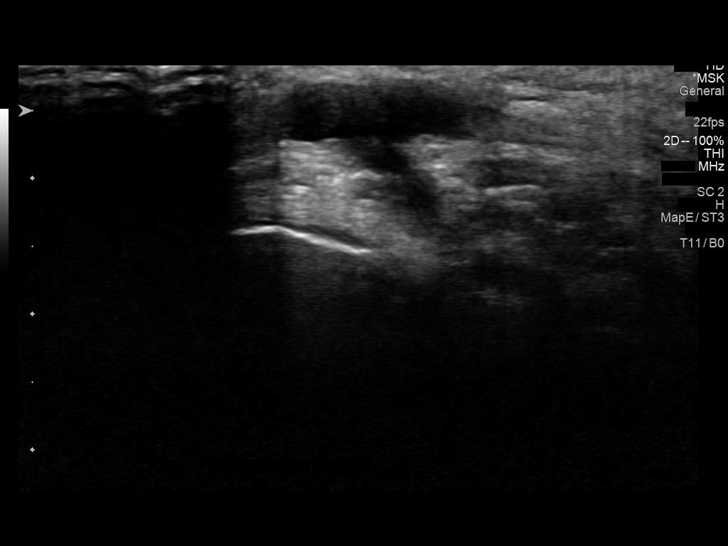
[im 13/16]
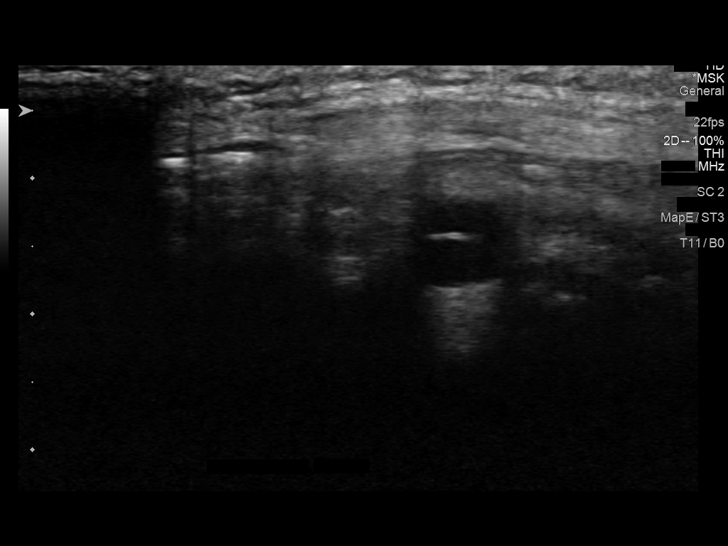
[im 14/16]
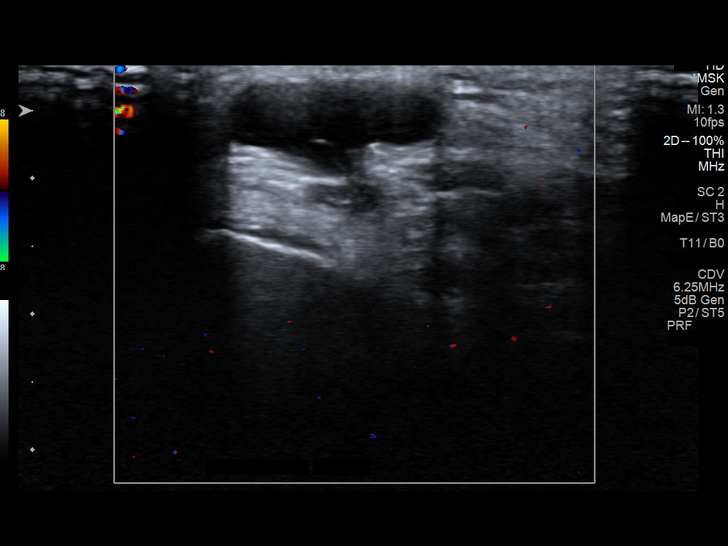
[im 15/16]
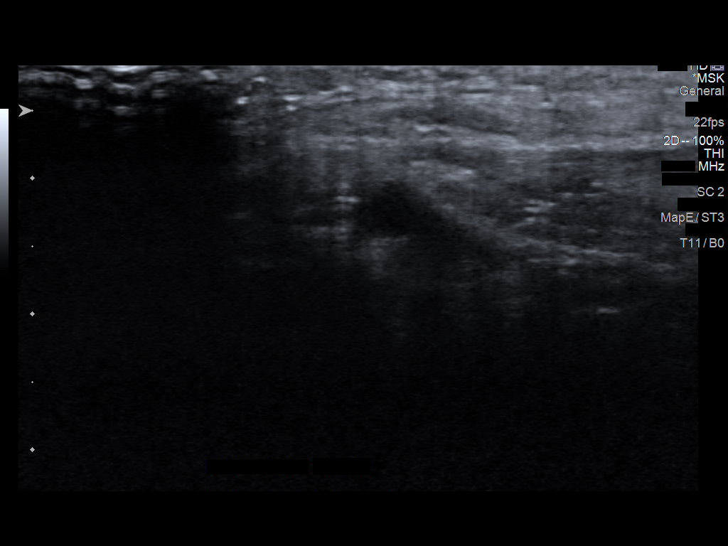
[im 16/16]
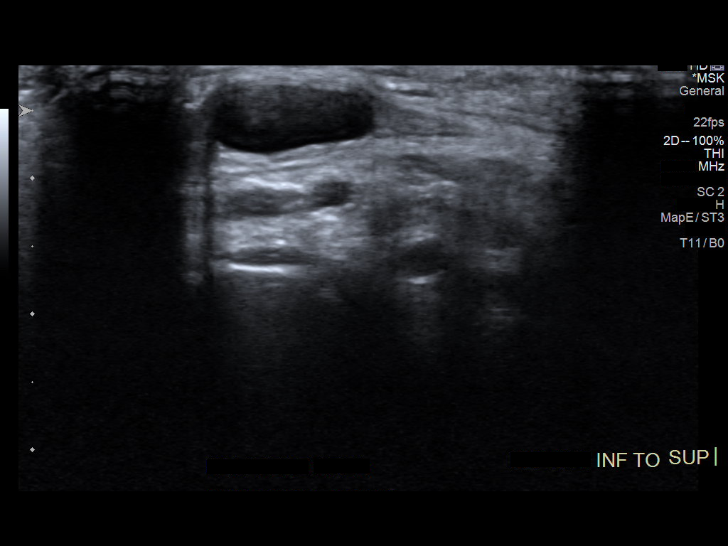

[14 of 16 positions shown; findings below may reference images not displayed]

FINDINGS: The palpable concern in the anterolateral aspect of the right knee
corresponds with a cystic lesion which measures approximately 1.6 x
2.5 x 1.2 cm. This demonstrates no solid components or abnormal
vascularity. This has a superficial component, as well as a deeper
component which may extend into the joint.
IMPRESSION: The patient's palpable concern is cystic. Major differential
considerations include synovial cyst and parameniscal cyst. Consider
MRI for further evaluation.

## 2022-03-23 DIAGNOSIS — R748 Abnormal levels of other serum enzymes: Secondary | ICD-10-CM | POA: Diagnosis not present

## 2022-03-23 DIAGNOSIS — R945 Abnormal results of liver function studies: Secondary | ICD-10-CM | POA: Diagnosis not present

## 2022-05-04 DIAGNOSIS — H43313 Vitreous membranes and strands, bilateral: Secondary | ICD-10-CM | POA: Diagnosis not present

## 2022-08-18 DIAGNOSIS — F329 Major depressive disorder, single episode, unspecified: Secondary | ICD-10-CM | POA: Diagnosis not present

## 2022-08-18 DIAGNOSIS — I10 Essential (primary) hypertension: Secondary | ICD-10-CM | POA: Diagnosis not present

## 2022-08-18 DIAGNOSIS — K219 Gastro-esophageal reflux disease without esophagitis: Secondary | ICD-10-CM | POA: Diagnosis not present

## 2022-08-18 DIAGNOSIS — E785 Hyperlipidemia, unspecified: Secondary | ICD-10-CM | POA: Diagnosis not present

## 2022-10-21 DIAGNOSIS — J029 Acute pharyngitis, unspecified: Secondary | ICD-10-CM | POA: Diagnosis not present

## 2022-10-21 DIAGNOSIS — Z20818 Contact with and (suspected) exposure to other bacterial communicable diseases: Secondary | ICD-10-CM | POA: Diagnosis not present

## 2022-10-21 DIAGNOSIS — M79674 Pain in right toe(s): Secondary | ICD-10-CM | POA: Diagnosis not present

## 2022-11-02 ENCOUNTER — Ambulatory Visit: Payer: BC Managed Care – PPO | Admitting: Podiatry

## 2022-11-18 ENCOUNTER — Ambulatory Visit: Payer: BC Managed Care – PPO | Admitting: Podiatry

## 2022-11-18 ENCOUNTER — Telehealth: Payer: Self-pay | Admitting: Podiatry

## 2022-11-18 NOTE — Telephone Encounter (Signed)
Pt left message today at 643am cxling his appt for today due to a family emergency. So he was not a no show.

## 2023-02-18 DIAGNOSIS — K219 Gastro-esophageal reflux disease without esophagitis: Secondary | ICD-10-CM | POA: Diagnosis not present

## 2023-02-18 DIAGNOSIS — E785 Hyperlipidemia, unspecified: Secondary | ICD-10-CM | POA: Diagnosis not present

## 2023-02-18 DIAGNOSIS — Z0000 Encounter for general adult medical examination without abnormal findings: Secondary | ICD-10-CM | POA: Diagnosis not present

## 2023-02-18 DIAGNOSIS — F329 Major depressive disorder, single episode, unspecified: Secondary | ICD-10-CM | POA: Diagnosis not present

## 2023-02-18 DIAGNOSIS — I10 Essential (primary) hypertension: Secondary | ICD-10-CM | POA: Diagnosis not present

## 2023-02-18 DIAGNOSIS — R7303 Prediabetes: Secondary | ICD-10-CM | POA: Diagnosis not present

## 2023-07-09 DIAGNOSIS — D127 Benign neoplasm of rectosigmoid junction: Secondary | ICD-10-CM | POA: Diagnosis not present

## 2023-07-09 DIAGNOSIS — Z1211 Encounter for screening for malignant neoplasm of colon: Secondary | ICD-10-CM | POA: Diagnosis not present

## 2023-07-09 DIAGNOSIS — K635 Polyp of colon: Secondary | ICD-10-CM | POA: Diagnosis not present

## 2023-07-09 DIAGNOSIS — D125 Benign neoplasm of sigmoid colon: Secondary | ICD-10-CM | POA: Diagnosis not present

## 2024-01-07 DIAGNOSIS — F1020 Alcohol dependence, uncomplicated: Secondary | ICD-10-CM | POA: Diagnosis not present

## 2024-01-13 DIAGNOSIS — Z720 Tobacco use: Secondary | ICD-10-CM | POA: Diagnosis not present

## 2024-01-13 DIAGNOSIS — I10 Essential (primary) hypertension: Secondary | ICD-10-CM | POA: Diagnosis not present

## 2024-01-13 DIAGNOSIS — R2240 Localized swelling, mass and lump, unspecified lower limb: Secondary | ICD-10-CM | POA: Diagnosis not present

## 2024-01-13 DIAGNOSIS — M9901 Segmental and somatic dysfunction of cervical region: Secondary | ICD-10-CM | POA: Diagnosis not present

## 2024-01-13 DIAGNOSIS — R600 Localized edema: Secondary | ICD-10-CM | POA: Diagnosis not present

## 2024-01-13 DIAGNOSIS — M25511 Pain in right shoulder: Secondary | ICD-10-CM | POA: Diagnosis not present

## 2024-01-13 DIAGNOSIS — R079 Chest pain, unspecified: Secondary | ICD-10-CM | POA: Diagnosis not present

## 2024-01-13 DIAGNOSIS — M9902 Segmental and somatic dysfunction of thoracic region: Secondary | ICD-10-CM | POA: Diagnosis not present

## 2024-01-13 DIAGNOSIS — D696 Thrombocytopenia, unspecified: Secondary | ICD-10-CM | POA: Diagnosis not present

## 2024-01-13 DIAGNOSIS — T679XXA Effect of heat and light, unspecified, initial encounter: Secondary | ICD-10-CM | POA: Diagnosis not present

## 2024-01-13 DIAGNOSIS — M9904 Segmental and somatic dysfunction of sacral region: Secondary | ICD-10-CM | POA: Diagnosis not present

## 2024-01-13 DIAGNOSIS — F1020 Alcohol dependence, uncomplicated: Secondary | ICD-10-CM | POA: Diagnosis not present

## 2024-01-13 DIAGNOSIS — J9811 Atelectasis: Secondary | ICD-10-CM | POA: Diagnosis not present

## 2024-01-13 DIAGNOSIS — R519 Headache, unspecified: Secondary | ICD-10-CM | POA: Diagnosis not present

## 2024-01-13 DIAGNOSIS — M25519 Pain in unspecified shoulder: Secondary | ICD-10-CM | POA: Diagnosis not present

## 2024-01-13 DIAGNOSIS — M9903 Segmental and somatic dysfunction of lumbar region: Secondary | ICD-10-CM | POA: Diagnosis not present

## 2024-01-13 DIAGNOSIS — M531 Cervicobrachial syndrome: Secondary | ICD-10-CM | POA: Diagnosis not present

## 2024-01-13 DIAGNOSIS — M62838 Other muscle spasm: Secondary | ICD-10-CM | POA: Diagnosis not present

## 2024-01-13 DIAGNOSIS — R609 Edema, unspecified: Secondary | ICD-10-CM | POA: Diagnosis not present

## 2024-01-13 DIAGNOSIS — Z79899 Other long term (current) drug therapy: Secondary | ICD-10-CM | POA: Diagnosis not present

## 2024-01-13 DIAGNOSIS — R0789 Other chest pain: Secondary | ICD-10-CM | POA: Diagnosis not present

## 2024-01-16 ENCOUNTER — Encounter (HOSPITAL_COMMUNITY): Payer: Self-pay | Admitting: Emergency Medicine

## 2024-01-16 ENCOUNTER — Emergency Department (HOSPITAL_COMMUNITY)

## 2024-01-16 ENCOUNTER — Emergency Department (HOSPITAL_COMMUNITY)
Admission: EM | Admit: 2024-01-16 | Discharge: 2024-01-17 | Disposition: A | Discharge status: Home / Self Care | Attending: Emergency Medicine | Admitting: Emergency Medicine

## 2024-01-16 DIAGNOSIS — J9811 Atelectasis: Secondary | ICD-10-CM | POA: Diagnosis not present

## 2024-01-16 DIAGNOSIS — I10 Essential (primary) hypertension: Secondary | ICD-10-CM | POA: Diagnosis not present

## 2024-01-16 DIAGNOSIS — R2240 Localized swelling, mass and lump, unspecified lower limb: Secondary | ICD-10-CM | POA: Insufficient documentation

## 2024-01-16 DIAGNOSIS — Z720 Tobacco use: Secondary | ICD-10-CM | POA: Insufficient documentation

## 2024-01-16 DIAGNOSIS — R079 Chest pain, unspecified: Secondary | ICD-10-CM | POA: Diagnosis not present

## 2024-01-16 DIAGNOSIS — M62838 Other muscle spasm: Secondary | ICD-10-CM | POA: Diagnosis not present

## 2024-01-16 DIAGNOSIS — R0789 Other chest pain: Secondary | ICD-10-CM | POA: Diagnosis not present

## 2024-01-16 DIAGNOSIS — M25511 Pain in right shoulder: Secondary | ICD-10-CM | POA: Insufficient documentation

## 2024-01-16 DIAGNOSIS — D696 Thrombocytopenia, unspecified: Secondary | ICD-10-CM | POA: Insufficient documentation

## 2024-01-16 DIAGNOSIS — Z79899 Other long term (current) drug therapy: Secondary | ICD-10-CM | POA: Diagnosis not present

## 2024-01-16 DIAGNOSIS — R600 Localized edema: Secondary | ICD-10-CM

## 2024-01-16 LAB — CBC
HCT: 40.2 % (ref 39.0–52.0)
Hemoglobin: 13.3 g/dL (ref 13.0–17.0)
MCH: 31.2 pg (ref 26.0–34.0)
MCHC: 33.1 g/dL (ref 30.0–36.0)
MCV: 94.4 fL (ref 80.0–100.0)
Platelets: 312 10*3/uL (ref 150–400)
RBC: 4.26 MIL/uL (ref 4.22–5.81)
RDW: 13.3 % (ref 11.5–15.5)
WBC: 7 10*3/uL (ref 4.0–10.5)
nRBC: 0 % (ref 0.0–0.2)

## 2024-01-16 LAB — TROPONIN I (HIGH SENSITIVITY): Troponin I (High Sensitivity): 5 ng/L (ref <18)

## 2024-01-16 MED ORDER — METHOCARBAMOL 500 MG PO TABS
750.0000 mg | ORAL_TABLET | Freq: Once | ORAL | Status: AC
  Administered 2024-01-16: 750 mg via ORAL
  Filled 2024-01-16: qty 2

## 2024-01-16 NOTE — ED Notes (Signed)
Bedside Xray

## 2024-01-16 NOTE — ED Triage Notes (Signed)
 Patient BIB EMS from [facility].  Patient is c/o R shoulder/R chest pain that radiates down his R arm, diaphoresis, tremors. 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 Patient is also c/o lower extremity swelling.  324 ASA 0.4 Nitroglycerin Tab

## 2024-01-16 NOTE — ED Provider Notes (Signed)
 [place] EMERGENCY DEPARTMENT AT [facility] Provider Note   CSN: [id number] Arrival date & time: 01/16/24  2208     History {Add pertinent medical, surgical, social history, OB history to HPI:1} Chief Complaint  Patient presents with   Chest Pain    Mike Maddox is a 48 y.o. male.  48 yo male brought in by EMS from [facility] with complaint of pain in his right shoulder and ankle swelling. 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 States he missed his afternoon muscle relaxor. Developed bilateral ankle swelling the past few days, no history of same, was told this could be normal after stopping alcohol. Also right shoulder feeling "locked up." Should discomfort has improved after nitroglycerin and ASA with EMS. Now with a slight discomfort under left pec.   Reports dad had 2 stents in his 62s.       Home Medications Prior to Admission medications   Medication Sig Start Date End Date Taking? Authorizing Provider  buPROPion (WELLBUTRIN XL) 150 MG 24 hr tablet Take 150 mg by mouth daily. 11/23/18   [provider]  citalopram (CELEXA) 40 MG tablet Take 40 mg by mouth daily. 11/23/18   [provider]  ezetimibe  (ZETIA ) 10 MG tablet TAKE ONE TABLET BY MOUTH DAILY 01/16/20   Nishan, Peter C, MD  losartan  (COZAAR ) 50 MG tablet TAKE ONE TABLET BY MOUTH DAILY 01/23/20   Nishan, Peter C, MD  rosuvastatin (CRESTOR) 40 MG tablet Take 40 mg by mouth daily. 11/23/18   [provider]      Allergies    Ambien [zolpidem], Chantix [varenicline tartrate], and Lipitor [atorvastatin]    Review of Systems   Review of Systems Negative except as per HPI Physical Exam Updated Vital Signs BP (!) 155/88 (BP Location: Right Arm)   Pulse 86   Temp 98 F (36.7 C) (Oral)   Resp 20   Ht 6' (1.829 m)   Wt  95.3 kg   SpO2 96%   BMI 28.48 kg/m  Physical Exam Vitals and nursing note reviewed.  Constitutional:      General: He is not in acute distress.    Appearance: He is well-developed. He is not diaphoretic.  HENT:     Head: Normocephalic and atraumatic.  Cardiovascular:     Rate and Rhythm: Normal rate and regular rhythm.     Heart sounds: Normal heart sounds.  Pulmonary:     Effort: Pulmonary effort is normal.  Chest:     Chest wall: No tenderness.  Abdominal:     Palpations: Abdomen is soft.     Tenderness: There is no abdominal tenderness.  Musculoskeletal:     Right shoulder: Tenderness present. No bony tenderness or crepitus. Decreased range of motion.     Left shoulder: Normal.       Arms:     Cervical back: Neck supple.     Right lower leg: Edema present.     Left lower leg: Edema present.     Comments: Bilateral ankles, non pitting  Skin:    General: Skin is warm and dry.     Findings: No erythema or rash.  Neurological:     Mental Status: He is alert and oriented to person, place, and time.  Psychiatric:        Behavior: Behavior normal.  ED Results / Procedures / Treatments   Labs (all labs ordered are listed, but only abnormal results are displayed) Labs Reviewed - No data to display  EKG EKG Interpretation Date/Time:  Sunday Jan 16 2024 22:17:56 EDT Ventricular Rate:  86 PR Interval:  140 QRS Duration:  82 QT Interval:  342 QTC Calculation: 409 R Axis:   44  Text Interpretation: Sinus rhythm No old tracing to compare Confirmed by Racheal Buddle [id number]) on 01/16/2024 10:33:07 PM  Radiology No results found.  Procedures Procedures  {Document cardiac monitor, telemetry assessment procedure when appropriate:1}  Medications Ordered in ED Medications  methocarbamol (ROBAXIN) tablet 750 mg (has no administration in time range)    ED Course/ Medical Decision Making/ A&P   {   Click here for ABCD2, HEART and other calculatorsREFRESH Note  before signing :1}                              Medical Decision Making  This patient presents to the ED for concern of ***, this involves an extensive number of treatment options, and is a complaint that carries with it a high risk of complications and morbidity.  The differential diagnosis includes ***   Co morbidities that complicate the patient evaluation  ***   Additional history obtained:  Additional history obtained from *** External records from outside source obtained and reviewed including ***   Lab Tests:  I Ordered, and personally interpreted labs.  The pertinent results include:  ***   Imaging Studies ordered:  I ordered imaging studies including ***  I independently visualized and interpreted imaging which showed *** I agree with the radiologist interpretation   Cardiac Monitoring: / EKG:  The patient was maintained on a cardiac monitor.  I personally viewed and interpreted the cardiac monitored which showed an underlying rhythm of: ***   Problem List / ED Course / Critical interventions / Medication management  *** I ordered medication including ***  for ***  Reevaluation of the patient after these medicines showed that the patient {resolved/improved/worsened:23923::"improved"} I have reviewed the patients home medicines and have made adjustments as needed   Consultations Obtained:  I requested consultation with the ***,  and discussed lab and imaging findings as well as pertinent plan - they recommend: ***   Social Determinants of Health:  ***   Test / Admission - Considered:  ***   {Document critical care time when appropriate:1} {Document review of labs and clinical decision tools ie heart score, Chads2Vasc2 etc:1}  {Document your independent review of radiology images, and any outside records:1} {Document your discussion with family members, caretakers, and with consultants:1} {Document social determinants of health affecting pt's  care:1} {Document your decision making why or why not admission, treatments were needed:1} Final Clinical Impression(s) / ED Diagnoses Final diagnoses:  None    Rx / DC Orders ED Discharge Orders     None

## 2024-01-17 ENCOUNTER — Emergency Department (HOSPITAL_COMMUNITY)

## 2024-01-17 ENCOUNTER — Encounter (HOSPITAL_COMMUNITY): Payer: Self-pay

## 2024-01-17 ENCOUNTER — Other Ambulatory Visit: Payer: Self-pay

## 2024-01-17 ENCOUNTER — Emergency Department (HOSPITAL_COMMUNITY)
Admission: EM | Admit: 2024-01-17 | Discharge: 2024-01-18 | Disposition: A | Discharge status: Home / Self Care | Source: Home / Self Care | Attending: Emergency Medicine | Admitting: Emergency Medicine

## 2024-01-17 DIAGNOSIS — M62838 Other muscle spasm: Secondary | ICD-10-CM | POA: Insufficient documentation

## 2024-01-17 DIAGNOSIS — Z79899 Other long term (current) drug therapy: Secondary | ICD-10-CM | POA: Insufficient documentation

## 2024-01-17 DIAGNOSIS — I10 Essential (primary) hypertension: Secondary | ICD-10-CM | POA: Insufficient documentation

## 2024-01-17 DIAGNOSIS — M25511 Pain in right shoulder: Secondary | ICD-10-CM | POA: Insufficient documentation

## 2024-01-17 LAB — BASIC METABOLIC PANEL WITH GFR
Anion gap: 9 (ref 5–15)
BUN: 12 mg/dL (ref 6–20)
CO2: 22 mmol/L (ref 22–32)
Calcium: 9.2 mg/dL (ref 8.9–10.3)
Chloride: 105 mmol/L (ref 98–111)
Creatinine, Ser: 0.89 mg/dL (ref 0.61–1.24)
GFR, Estimated: >60 mL/min (ref >60)
Glucose, Bld: 115 mg/dL — ABNORMAL HIGH (ref 70–99)
Potassium: 4.1 mmol/L (ref 3.5–5.1)
Sodium: 136 mmol/L (ref 135–145)

## 2024-01-17 LAB — TROPONIN I (HIGH SENSITIVITY): Troponin I (High Sensitivity): 5 ng/L (ref <18)

## 2024-01-17 MED ORDER — ACETAMINOPHEN 325 MG PO TABS
650.0000 mg | ORAL_TABLET | Freq: Once | ORAL | Status: AC
  Administered 2024-01-17: 650 mg via ORAL
  Filled 2024-01-17: qty 2

## 2024-01-17 MED ORDER — ONDANSETRON 4 MG PO TBDP
4.0000 mg | ORAL_TABLET | Freq: Once | ORAL | Status: AC
  Administered 2024-01-17: 4 mg via ORAL
  Filled 2024-01-17: qty 1

## 2024-01-17 MED ORDER — ACETAMINOPHEN 500 MG PO TABS
1000.0000 mg | ORAL_TABLET | Freq: Once | ORAL | Status: AC
  Administered 2024-01-17: 1000 mg via ORAL
  Filled 2024-01-17: qty 2

## 2024-01-17 NOTE — ED Triage Notes (Signed)
 Pt BIB EMS from [facility] with c/o right shoulder pain that radiates down into his arm. Pt seen for the same yesterday. Pt returns to be seen due to facility making him come or he would be released from [facility]. Pt denies CP or SOB.

## 2024-01-17 NOTE — ED Provider Triage Note (Signed)
 Emergency Medicine Provider Triage Evaluation Note  Mike Maddox , a 48 y.o. male  was evaluated in triage.  Pt complains of right shoulder pain.  Seen for same yesterday with negative cardiac work-up.  He feels it is MSK but [facility] required him to be evaluated again.  He is stressed out as he is going through detox, tends to get a lot of muscle "knots" when stressed.  No prior cardiac hx.  Does have headache from NTG given yesterday.  Review of Systems  Positive: Shoulder pain Negative: Fever, chills  Physical Exam  BP (!) 146/96   Pulse 80   Temp 97.6 F (36.4 C) (Oral)   Resp 15   Ht 6' (1.829 m)   Wt 95.3 kg   SpO2 95%   BMI 28.48 kg/m  Gen:   Awake, no distress   Resp:  Normal effort  MSK:   Moves extremities without difficulty  Other:  Some mild soreness on exam, no deformities  Medical Decision Making  Medically screening exam initiated at 10:31 PM.  Appropriate orders placed.  Mike Maddox was informed that the remainder of the evaluation will be completed by another provider, this initial triage assessment does not replace that evaluation, and the importance of remaining in the ED until their evaluation is complete.  Shoulder pain.  Seems more MSK.  Will obtain EKG to ensure stability and screening x-ray.  Headache meds given.   Coretha Dew, PA-C 01/17/24 2233

## 2024-01-17 NOTE — Discharge Instructions (Signed)
 Return to the ER for worsening or concerning symptoms. Follow up with primary care provider for recheck. Recommend compression socks for ankle swelling. Monitor salt intake. Elevate legs when possible.

## 2024-01-18 NOTE — ED Notes (Signed)
 Called [facility] and gave report.  They requested that documentation be sent.  Pt signed release of info and paperwork faxed.

## 2024-01-18 NOTE — ED Provider Notes (Signed)
 [place] EMERGENCY DEPARTMENT AT [facility] Provider Note   CSN: [id number] Arrival date & time: 01/17/24  2143     History  Chief Complaint  Patient presents with   Shoulder Pain    Mike Maddox is a 48 y.o. male.  The history is provided by the patient and medical records.  Shoulder Pain  48 year old male with history of hyperlipidemia, depression, GERD, dyslipidemia, hypertension, presenting to the ED for right shoulder pain.  Patient currently at [facility] for alcohol detox.  He has finished his medications for this a few days ago.  He was seen in the ED last night for chest pain with negative workup.  Patient reports he developed some pain in his right shoulder today.  States he is not having chest pain or shortness of breath like yesterday, feels more like muscle soreness.  No radiation down the arm, into the jaw, diaphoresis, nausea, vomiting, etc. States he has been stressed out from being in rehab, usually when stressed he tends to get some muscle spasm and tension in his neck and back.  He has no prior cardiac history.  Home Medications Prior to Admission medications   Medication Sig Start Date End Date Taking? Authorizing Provider  buPROPion (WELLBUTRIN XL) 150 MG 24 hr tablet Take 150 mg by mouth daily. 11/23/18   [provider]  citalopram (CELEXA) 40 MG tablet Take 40 mg by mouth daily. 11/23/18   [provider]  ezetimibe  (ZETIA ) 10 MG tablet TAKE ONE TABLET BY MOUTH DAILY 01/16/20   Nishan, Peter C, MD  losartan  (COZAAR ) 50 MG tablet TAKE ONE TABLET BY MOUTH DAILY 01/23/20   Nishan, Peter C, MD  rosuvastatin (CRESTOR) 40 MG tablet Take 40 mg by mouth daily. 11/23/18   [provider]      Allergies    Ambien [zolpidem], Chantix [varenicline tartrate], and Lipitor [atorvastatin]    Review of Systems   Review of Systems  Musculoskeletal:  Positive for arthralgias.  All other systems reviewed and are  negative.   Physical Exam Updated Vital Signs BP (!) 142/91 (BP Location: Left Arm)   Pulse 83   Temp 97.7 F (36.5 C)   Resp 17   Ht 6' (1.829 m)   Wt 95.3 kg   SpO2 100%   BMI 28.48 kg/m   Physical Exam Vitals and nursing note reviewed.  Constitutional:      Appearance: He is well-developed.  HENT:     Head: Normocephalic and atraumatic.  Eyes:     Conjunctiva/sclera: Conjunctivae normal.     Pupils: Pupils are equal, round, and reactive to light.  Cardiovascular:     Rate and Rhythm: Normal rate and regular rhythm.     Heart sounds: Normal heart sounds.  Pulmonary:     Effort: Pulmonary effort is normal. No respiratory distress.     Breath sounds: Normal breath sounds. No rhonchi.  Musculoskeletal:        General: Normal range of motion.     Cervical back: Normal range of motion.     Comments: Mild soreness of right shoulder without acute deformity, minimal pain with ROM, arm is NVI  Skin:    General: Skin is warm and dry.  Neurological:     Mental Status: He is alert and oriented to person, place, and time.     ED Results / Procedures / Treatments   Labs (all labs ordered are listed, but only abnormal results are displayed) Labs Reviewed -  No data to display  EKG None  Radiology DG Shoulder Right Result Date: 01/17/2024 CLINICAL DATA:  Right shoulder pain EXAM: RIGHT SHOULDER - 2+ VIEW COMPARISON:  None Available. FINDINGS: There is no evidence of fracture or dislocation. There is no evidence of arthropathy or other focal bone abnormality. Soft tissues are unremarkable. IMPRESSION: Negative. Electronically Signed   By: Tyron Gallon M.D.   On: 01/17/2024 22:56   DG Chest Port 1 View Result Date: 01/16/2024 CLINICAL DATA:  Chest pain EXAM: PORTABLE CHEST 1 VIEW COMPARISON:  None Available. FINDINGS: Cardiac shadow is at the upper limits of normal in size. The lungs are well aerated bilaterally. Minimal left basilar atelectasis is seen. No bony abnormality is  noted. IMPRESSION: Mild left basilar atelectasis.  No other focal abnormality is seen. Electronically Signed   By: Violeta Grey M.D.   On: 01/16/2024 23:36    Procedures Procedures    Medications Ordered in ED Medications  ondansetron (ZOFRAN-ODT) disintegrating tablet 4 mg (4 mg Oral Given 01/17/24 2239)  acetaminophen (TYLENOL) tablet 1,000 mg (1,000 mg Oral Given 01/17/24 2239)    ED Course/ Medical Decision Making/ A&P                                 Medical Decision Making Amount and/or Complexity of Data Reviewed Radiology: ordered and independent interpretation performed. ECG/medicine tests: ordered and independent interpretation performed.  Risk OTC drugs. Prescription drug management.   47-year male presenting to the ED for right shoulder pain.  Seen and evaluated here for chest pain yesterday with reassuring workup.  He is not having any chest pain today, states it feels like muscle spasm or tension.  Has history of same when stressed out.  Currently undergoing alcohol rehab.  Afebrile and nontoxic in appearance here.  His vitals are stable on room air.  He has minimal soreness of the right shoulder but does not have any acute bony deformity.  No signs of infection.  His arm is neurovascularly intact.  X-ray is negative for any acute bony findings.  His EKG is normal sinus rhythm without acute ischemic changes.  Unchanged from yesterday.  As he is not having any chest pain, radiation of the pain, nausea, diaphoresis, vomiting, or shortness of breath, I have low suspicion for ACS and do not feel he needs repeat cardiac workup at this time.  Patient agrees and is comfortable not repeating labs today.  Feel he is stable for discharge back to [facility].  He does have access to as needed Tylenol, Motrin, and muscle relaxers if needed.  Can also use warm compresses to the area.  Can return here for any new or acute changes.  Final Clinical Impression(s) / ED Diagnoses Final  diagnoses:  Acute pain of right shoulder    Rx / DC Orders ED Discharge Orders     None         Coretha Dew, PA-C 01/18/24 2952    Kelsey Patricia, MD 01/18/24 [id number]

## 2024-01-18 NOTE — Discharge Instructions (Signed)
 Can use tylenol, motrin, or muscle relaxer when needed. Can also use heating pad/warm compresses to help with muscle soreness. Return here for any new/acute changes.

## 2024-01-24 DIAGNOSIS — M9902 Segmental and somatic dysfunction of thoracic region: Secondary | ICD-10-CM | POA: Diagnosis not present

## 2024-01-24 DIAGNOSIS — M531 Cervicobrachial syndrome: Secondary | ICD-10-CM | POA: Diagnosis not present

## 2024-01-24 DIAGNOSIS — M9901 Segmental and somatic dysfunction of cervical region: Secondary | ICD-10-CM | POA: Diagnosis not present

## 2024-01-24 DIAGNOSIS — M9904 Segmental and somatic dysfunction of sacral region: Secondary | ICD-10-CM | POA: Diagnosis not present

## 2024-01-24 DIAGNOSIS — M9903 Segmental and somatic dysfunction of lumbar region: Secondary | ICD-10-CM | POA: Diagnosis not present

## 2024-01-31 DIAGNOSIS — M9902 Segmental and somatic dysfunction of thoracic region: Secondary | ICD-10-CM | POA: Diagnosis not present

## 2024-01-31 DIAGNOSIS — M531 Cervicobrachial syndrome: Secondary | ICD-10-CM | POA: Diagnosis not present

## 2024-01-31 DIAGNOSIS — M9903 Segmental and somatic dysfunction of lumbar region: Secondary | ICD-10-CM | POA: Diagnosis not present

## 2024-01-31 DIAGNOSIS — M9904 Segmental and somatic dysfunction of sacral region: Secondary | ICD-10-CM | POA: Diagnosis not present

## 2024-01-31 DIAGNOSIS — M9901 Segmental and somatic dysfunction of cervical region: Secondary | ICD-10-CM | POA: Diagnosis not present

## 2024-02-08 DIAGNOSIS — F1020 Alcohol dependence, uncomplicated: Secondary | ICD-10-CM | POA: Diagnosis not present

## 2024-02-10 DIAGNOSIS — F1020 Alcohol dependence, uncomplicated: Secondary | ICD-10-CM | POA: Diagnosis not present

## 2024-02-14 DIAGNOSIS — F1020 Alcohol dependence, uncomplicated: Secondary | ICD-10-CM | POA: Diagnosis not present

## 2024-02-15 DIAGNOSIS — F1020 Alcohol dependence, uncomplicated: Secondary | ICD-10-CM | POA: Diagnosis not present

## 2024-02-17 DIAGNOSIS — F1020 Alcohol dependence, uncomplicated: Secondary | ICD-10-CM | POA: Diagnosis not present

## 2024-02-21 DIAGNOSIS — F1020 Alcohol dependence, uncomplicated: Secondary | ICD-10-CM | POA: Diagnosis not present

## 2024-02-22 DIAGNOSIS — F1020 Alcohol dependence, uncomplicated: Secondary | ICD-10-CM | POA: Diagnosis not present

## 2024-02-24 DIAGNOSIS — F1020 Alcohol dependence, uncomplicated: Secondary | ICD-10-CM | POA: Diagnosis not present

## 2024-02-28 DIAGNOSIS — F1020 Alcohol dependence, uncomplicated: Secondary | ICD-10-CM | POA: Diagnosis not present

## 2024-02-29 DIAGNOSIS — F1020 Alcohol dependence, uncomplicated: Secondary | ICD-10-CM | POA: Diagnosis not present

## 2024-02-29 DIAGNOSIS — I10 Essential (primary) hypertension: Secondary | ICD-10-CM | POA: Diagnosis not present

## 2024-03-02 DIAGNOSIS — F1020 Alcohol dependence, uncomplicated: Secondary | ICD-10-CM | POA: Diagnosis not present

## 2024-03-06 DIAGNOSIS — F1020 Alcohol dependence, uncomplicated: Secondary | ICD-10-CM | POA: Diagnosis not present

## 2024-03-07 DIAGNOSIS — F1020 Alcohol dependence, uncomplicated: Secondary | ICD-10-CM | POA: Diagnosis not present

## 2024-03-13 DIAGNOSIS — F1020 Alcohol dependence, uncomplicated: Secondary | ICD-10-CM | POA: Diagnosis not present

## 2024-03-14 DIAGNOSIS — F1020 Alcohol dependence, uncomplicated: Secondary | ICD-10-CM | POA: Diagnosis not present

## 2024-03-15 DIAGNOSIS — R0683 Snoring: Secondary | ICD-10-CM | POA: Diagnosis not present

## 2024-03-16 DIAGNOSIS — F1020 Alcohol dependence, uncomplicated: Secondary | ICD-10-CM | POA: Diagnosis not present

## 2024-03-21 DIAGNOSIS — F1020 Alcohol dependence, uncomplicated: Secondary | ICD-10-CM | POA: Diagnosis not present

## 2024-03-23 DIAGNOSIS — F1020 Alcohol dependence, uncomplicated: Secondary | ICD-10-CM | POA: Diagnosis not present

## 2024-03-27 DIAGNOSIS — F1020 Alcohol dependence, uncomplicated: Secondary | ICD-10-CM | POA: Diagnosis not present

## 2024-03-28 DIAGNOSIS — F1020 Alcohol dependence, uncomplicated: Secondary | ICD-10-CM | POA: Diagnosis not present

## 2024-03-30 DIAGNOSIS — F1020 Alcohol dependence, uncomplicated: Secondary | ICD-10-CM | POA: Diagnosis not present

## 2024-04-03 DIAGNOSIS — F1020 Alcohol dependence, uncomplicated: Secondary | ICD-10-CM | POA: Diagnosis not present

## 2024-04-16 DIAGNOSIS — G4733 Obstructive sleep apnea (adult) (pediatric): Secondary | ICD-10-CM | POA: Diagnosis not present

## 2024-04-24 DIAGNOSIS — G4733 Obstructive sleep apnea (adult) (pediatric): Secondary | ICD-10-CM | POA: Diagnosis not present

## 2024-06-01 DIAGNOSIS — G4733 Obstructive sleep apnea (adult) (pediatric): Secondary | ICD-10-CM | POA: Diagnosis not present

## 2024-06-20 DIAGNOSIS — Z1389 Encounter for screening for other disorder: Secondary | ICD-10-CM | POA: Diagnosis not present

## 2024-06-20 DIAGNOSIS — Z125 Encounter for screening for malignant neoplasm of prostate: Secondary | ICD-10-CM | POA: Diagnosis not present

## 2024-06-20 DIAGNOSIS — E785 Hyperlipidemia, unspecified: Secondary | ICD-10-CM | POA: Diagnosis not present

## 2024-06-27 DIAGNOSIS — R82998 Other abnormal findings in urine: Secondary | ICD-10-CM | POA: Diagnosis not present

## 2024-08-03 DIAGNOSIS — G4733 Obstructive sleep apnea (adult) (pediatric): Secondary | ICD-10-CM | POA: Diagnosis not present

## 2024-08-23 DIAGNOSIS — G4733 Obstructive sleep apnea (adult) (pediatric): Secondary | ICD-10-CM | POA: Diagnosis not present
